# Patient Record
Sex: Female | Born: 1960 | Race: White | Hispanic: No | Smoking: Former smoker
Health system: Southern US, Community
[De-identification: ages and names within clinical notes are randomized; demographics above are authoritative.]

## PROBLEM LIST (undated history)

## (undated) DIAGNOSIS — T7840XA Allergy, unspecified, initial encounter: Secondary | ICD-10-CM

## (undated) DIAGNOSIS — E785 Hyperlipidemia, unspecified: Secondary | ICD-10-CM

## (undated) HISTORY — DX: Allergy, unspecified, initial encounter: T78.40XA

## (undated) HISTORY — DX: Hyperlipidemia, unspecified: E78.5

---

## 2011-12-20 HISTORY — PX: BLADDER NECK RECONSTRUCTION: SHX1239

## 2016-04-25 ENCOUNTER — Other Ambulatory Visit (HOSPITAL_COMMUNITY): Payer: Self-pay | Admitting: Family Medicine

## 2016-04-25 DIAGNOSIS — Z8249 Family history of ischemic heart disease and other diseases of the circulatory system: Secondary | ICD-10-CM | POA: Diagnosis not present

## 2016-04-25 DIAGNOSIS — R1900 Intra-abdominal and pelvic swelling, mass and lump, unspecified site: Secondary | ICD-10-CM

## 2016-04-25 DIAGNOSIS — E785 Hyperlipidemia, unspecified: Secondary | ICD-10-CM | POA: Diagnosis not present

## 2016-04-28 ENCOUNTER — Ambulatory Visit (HOSPITAL_COMMUNITY)
Admission: RE | Admit: 2016-04-28 | Discharge: 2016-04-28 | Disposition: A | Discharge status: Home / Self Care | Payer: BLUE CROSS/BLUE SHIELD | Source: Ambulatory Visit | Attending: Family Medicine | Admitting: Family Medicine

## 2016-04-28 ENCOUNTER — Other Ambulatory Visit (HOSPITAL_COMMUNITY): Payer: Self-pay | Admitting: Family Medicine

## 2016-04-28 DIAGNOSIS — R1900 Intra-abdominal and pelvic swelling, mass and lump, unspecified site: Secondary | ICD-10-CM | POA: Diagnosis not present

## 2016-04-28 MED ORDER — IOPAMIDOL (ISOVUE-300) INJECTION 61%
INTRAVENOUS | Status: AC
  Administered 2016-04-28: 100 mL
  Filled 2016-04-28: qty 100

## 2017-01-25 DIAGNOSIS — J209 Acute bronchitis, unspecified: Secondary | ICD-10-CM | POA: Diagnosis not present

## 2017-07-04 DIAGNOSIS — H31092 Other chorioretinal scars, left eye: Secondary | ICD-10-CM | POA: Diagnosis not present

## 2017-10-18 ENCOUNTER — Telehealth: Payer: Self-pay

## 2017-10-18 NOTE — Telephone Encounter (Signed)
Pre visit call completed 

## 2017-10-19 ENCOUNTER — Encounter: Payer: Self-pay | Admitting: Medical

## 2017-10-19 ENCOUNTER — Ambulatory Visit (INDEPENDENT_AMBULATORY_CARE_PROVIDER_SITE_OTHER): Payer: BLUE CROSS/BLUE SHIELD | Admitting: Medical

## 2017-10-19 VITALS — BP 116/74 | HR 72 | Temp 98.4°F | Resp 16 | Ht 68.0 in | Wt 186.4 lb

## 2017-10-19 DIAGNOSIS — D179 Benign lipomatous neoplasm, unspecified: Secondary | ICD-10-CM

## 2017-10-19 DIAGNOSIS — J3089 Other allergic rhinitis: Secondary | ICD-10-CM

## 2017-10-19 MED ORDER — LEVOCETIRIZINE DIHYDROCHLORIDE 5 MG PO TABS: 5.0000 mg | ORAL_TABLET | Freq: Every evening | ORAL | 0 refills | Status: DC

## 2017-10-19 MED ORDER — MONTELUKAST SODIUM 10 MG PO TABS: 10.0000 mg | ORAL_TABLET | Freq: Every day | ORAL | 0 refills | Status: DC

## 2017-10-19 MED ORDER — AZITHROMYCIN 250 MG PO TABS: ORAL_TABLET | ORAL | 0 refills | Status: DC

## 2017-10-19 MED ORDER — FLUTICASONE PROPIONATE 50 MCG/ACT NA SUSP: 2.0000 | Freq: Every day | NASAL | 0 refills | Status: DC

## 2017-10-19 MED FILL — AZITHROMYCIN 250 MG TABLET: 250 | 5 days supply | Qty: 6 | Fill #0

## 2017-10-19 NOTE — Patient Instructions (Addendum)
For allergic rhinitis fall and spring recommend xyzal and flonase early on in seasons. And could add montelukast.(dc current allergy med regimen)  For history of lipomas ask you show Dr. Lorelei Pont Ct and palpate your lipomas.May refer to general surgeon.  Low grade fever last night but not now. In light of recent bronchitis if fever returns recommend start azithromycin antibiotic.  Follow up with pcp on Monday or as needed

## 2017-10-19 NOTE — Progress Notes (Signed)
Subjective:    Patient ID: Ashley Wood, female    DOB: 16-Aug-1961, 56 y.o.   MRN: [id number]  HPI  Pt in for first time.   Pt from [place], She does not exercise regularly. Pt eats healthy, low sugar diet, 2 cups a day. 2 children.   Pt states she had some recent bronchitis for 6 weeks. Some nasal congestion and sneezing on and off over past 6 weeks. She states 2-3 weeks or so into allergy flare had bronchitis. Pt states she never took antibiotic. She overall feels better. In fall she states usually does get allergies. She only takes Human resources officer daily. If her allergies worsen add benadryl. Recently added flonase. She explains her regimen may not be working.  In past allergies late summer and early fall.   Pt has high cholesterol history. Pt is on lipitor.   Pt states menopause since 2010.     Review of Systems  Constitutional: Positive for chills. Negative for fatigue and fever.       But low grade fever last night. Now no chills or fever.  HENT: Negative for congestion, ear discharge, nosebleeds, postnasal drip, rhinorrhea, sinus pain and sinus pressure.        Many of allergy symptoms now better.  Respiratory: Negative for cough, chest tightness, shortness of breath and wheezing.        Patient state for 3 weeks had cough that sounded like a seal at times.  Cardiovascular: Negative for chest pain and palpitations.  Gastrointestinal: Negative for abdominal pain, nausea and rectal pain.  Musculoskeletal: Negative for back pain.  Skin: Negative for rash.  Neurological: Negative for dizziness, syncope, weakness and headaches.  Hematological: Negative for adenopathy. Does not bruise/bleed easily.  Psychiatric/Behavioral: Negative for behavioral problems, confusion, decreased concentration and hallucinations. The patient is not nervous/anxious and is not hyperactive.    Past Medical History:  Diagnosis Date  . Hyperlipidemia      Social History   Social History  . Marital  status: Divorced    Spouse name: N/A  . Number of children: N/A  . Years of education: N/A   Occupational History  . Not on file.   Social History Main Topics  . Smoking status: Never Smoker  . Smokeless tobacco: Never Used  . Alcohol use Yes  . Drug use: Unknown  . Sexual activity: Not on file   Other Topics Concern  . Not on file   Social History Narrative  . No narrative on file    Past Surgical History:  Procedure Laterality Date  . BLADDER NECK RECONSTRUCTION  2013    Family History  Problem Relation Age of Onset  . Hyperlipidemia Mother   . Breast cancer Mother   . Hypertension Father   . Hyperlipidemia Father   . Breast cancer Maternal Grandmother     No Known Allergies  No current outpatient prescriptions on file prior to visit.   No current facility-administered medications on file prior to visit.     BP 116/74   Pulse 72   Temp 98.4 F (36.9 C) (Oral)   Resp 16   Ht 5\' 8"  (1.727 m)   Wt 186 lb 6.4 oz (84.6 kg)   SpO2 100%   BMI 28.34 kg/m       Objective:   Physical Exam   General  Mental Status - Alert. General Appearance - Well groomed. Not in acute distress.  Skin Rashes- No Rashes.  HEENT Head- Normal. Ear Auditory Canal - Left-  Normal. Right - Normal.Tympanic Membrane- Left- Normal. Right- Normal. Eye Sclera/Conjunctiva- Left- Normal. Right- Normal. Nose & Sinuses Nasal Mucosa- Left-  Boggy and Congested. Right-  Boggy and  Congested.Bilateral maxillary and frontal sinus pressure. Mouth & Throat Lips: Upper Lip- Normal: no dryness, cracking, pallor, cyanosis, or vesicular eruption. Lower Lip-Normal: no dryness, cracking, pallor, cyanosis or vesicular eruption. Buccal Mucosa- Bilateral- No Aphthous ulcers. Oropharynx- No Discharge or Erythema. Tonsils: Characteristics- Bilateral- No Erythema or Congestion. Size/Enlargement- Bilateral- No enlargement. Discharge- bilateral-None.  Neck Neck- Supple. No Masses.   Chest and  Lung Exam Auscultation: Breath Sounds:-Clear even and unlabored.  Cardiovascular Auscultation:Rythm- Regular, rate and rhythm. Murmurs & Other Heart Sounds:Ausculatation of the heart reveal- No Murmurs.  Lymphatic Head & Neck General Head & Neck Lymphatics: Bilateral: Description- No Localized lymphadenopathy.  Skin- left elbow feels like moderate sized lipoma. Left upper cva area and anterior thorax rt lower rib area.      Assessment & Plan:  For allergic rhinitis fall and spring recommend xyzal and flonase early on in seasons. And could add montelukast.(dc current allergy med regimen)  For history of lipomas ask you show Dr. Lorelei Pont Ct and palpate your lipomas.May refer to general surgeon.  Low grade fever last night but not now. In light of recent bronchitis if fever returns recommend start azithromycin antibiotic.  Follow up with pcp on Monday or as needed  Johncarlos Holtsclaw, [place], [place]

## 2017-10-21 NOTE — Progress Notes (Addendum)
[facility] [address], [place], [place] [address] [phone]  Date:  10/23/2017   Name:  Ashley Wood   DOB:  05/09/1961   MRN:  [id number]  PCP:  Ashley Mclean, MD    Chief Complaint: Annual Exam   History of Present Illness:  Ashley Wood is a 56 y.o. very pleasant female patient who presents with the following:  New patient to my clinic, here today because she needs a CPE/ PPW for her job History of allergies and hyperlipidemia  She needs biometrics done for her job- we will order these labs for her today Her prior PCP left his practice so she needs to establish with a new person, and she moved from [place] to this area for her job  She does have hyperlipidemia but this has been well controlled with medication.  She ended up coming off her lipitor- she ran out and stopped taking it as she was not sure if she needed to continue to take it.   She is a former smoker She has gained about 10 lbs over the last couple of years, and admits she has not been taking the same care of herself  She was seen here recently with allergies that "turned into bronchitis".   She took abx in March and again last week.    She is an Primary school teacher. She has 2 grown kids and 4 grands- they live near [place] She moved here due to her job  In her free time she likes to spent time with her family near [place] She and her friends enjoy looking at [employer] was in 2014- she needs to catch up on this, she does have a family history of breast cancer Flu shot done for the year   She is fasting today for labs and would like to have BS Patient Active Problem List   Diagnosis Date Noted  . Hyperlipidemia 10/23/2017    Past Medical History:  Diagnosis Date  . Allergy   . Hyperlipidemia     Past Surgical History:  Procedure Laterality Date  . BLADDER NECK RECONSTRUCTION  2013    Social History    Tobacco Use  . Smoking status: Former Smoker    Packs/day: 1.00    Years: 10.00    Pack years: 10.00    Types: Cigarettes    Last attempt to quit: 10/19/1985    Years since quitting: 32.0  . Smokeless tobacco: Never Used  Substance Use Topics  . Alcohol use: Yes    Comment: 1 glass wine  every other night.  . Drug use: No    Family History  Problem Relation Age of Onset  . Hyperlipidemia Mother   . Breast cancer Mother   . Hypertension Father   . Hyperlipidemia Father   . Breast cancer Maternal Grandmother     No Known Allergies  Medication list has been reviewed and updated.  Current Outpatient Medications on File Prior to Visit  Medication Sig Dispense Refill  . aspirin 81 MG tablet Take 81 mg by mouth daily.    Marland Kitchen atorvastatin (LIPITOR) 10 MG tablet Take 10 mg by mouth daily.    Marland Kitchen azithromycin (ZITHROMAX) 250 MG tablet Take 2 tablets by mouth on day 1, followed by 1 tablet by mouth daily for 4 days. 6 tablet 0  . fluticasone (FLONASE) 50 MCG/ACT nasal spray Place 2 sprays into both nostrils daily. Anna  g 0  . levocetirizine (XYZAL) 5 MG tablet Take 1 tablet (5 mg total) by mouth every evening. 90 tablet 0  . montelukast (SINGULAIR) 10 MG tablet Take 1 tablet (10 mg total) by mouth at bedtime. 90 tablet 0   No current facility-administered medications on file prior to visit.     Review of Systems:  As per HPI- otherwise negative.   Physical Examination: Vitals:   10/23/17 1506  BP: 132/80  Pulse: 60  Resp: 16  Temp: 97.7 F (36.5 C)  SpO2: 100%   Vitals:   10/23/17 1506  Weight: 185 lb 3.2 oz (84 kg)   Body mass index is 28.16 kg/m. Ideal Body Weight:    GEN: WDWN, NAD, Non-toxic, A & O x 3, mild overweight, looks well HEENT: Atraumatic, Normocephalic. Neck supple. No masses, No LAD.    Bilateral TM wnl, oropharynx normal.  PEERL,EOMI.   Ears and Nose: No external deformity. CV: RRR, No M/G/R. No JVD. No thrill. No extra heart sounds. PULM: CTA B,  no wheezes, crackles, rhonchi. No retractions. No resp. distress. No accessory muscle use. ABD: S, NT, ND, +BS. No rebound. No HSM. EXTR: No c/c/e NEURO Normal gait.  PSYCH: Normally interactive. Conversant. Not depressed or anxious appearing.  Calm demeanor.  She has a bump overlying her RUQ- feels like a superficial lipoma.  approx 3 x 3 cm in size   Assessment and Plan: Hyperlipidemia, unspecified hyperlipidemia type - Plan: Lipid panel  Screening for deficiency anemia - Plan: CBC  Screening for diabetes mellitus - Plan: Comprehensive metabolic panel, Hemoglobin A1c  Encounter for hepatitis C screening test for low risk patient - Plan: Hepatitis C antibody  Lipoma of abdominal wall - Plan: US Abdomen Limited RUQ  Rosacea - Plan: Azelaic Acid 15 % cream  Here today to establish care with me and discuss a couple of things.  Labs pending as above Will complete her biometric paperwork for her when results are in Encouraged mammogram asap  Her colonoscopy is UTD   Signed Lamar Blinks, MD  Received her labs- called pt and LMOM.  I am going to put her back on her lipitor.  Will rx for her   Letter to pt as well  Results for orders placed or performed in visit on 10/23/17  CBC  Result Value Ref Range   WBC 5.8 4.0 - 10.5 K/uL   RBC 4.95 3.87 - 5.11 Mil/uL   Platelets 201.0 150.0 - 400.0 K/uL   Hemoglobin 14.4 12.0 - 15.0 g/dL   HCT 43.2 36.0 - 46.0 %   MCV 87.4 78.0 - 100.0 fl   MCHC 33.3 30.0 - 36.0 g/dL   RDW 14.2 11.5 - 15.5 %  Comprehensive metabolic panel  Result Value Ref Range   Sodium 138 135 - 145 mEq/L   Potassium 4.3 3.5 - 5.1 mEq/L   Chloride 103 96 - 112 mEq/L   CO2 25 19 - 32 mEq/L   Glucose, Bld 84 70 - 99 mg/dL   BUN 15 6 - 23 mg/dL   Creatinine, Ser 0.86 0.40 - 1.20 mg/dL   Total Bilirubin 0.6 0.2 - 1.2 mg/dL   Alkaline Phosphatase 73 39 - 117 U/L   AST 18 0 - 37 U/L   ALT 23 0 - 35 U/L   Total Protein 7.9 6.0 - 8.3 g/dL   Albumin 4.9 3.5 -  5.2 g/dL   Calcium 10.2 8.4 - 10.5 mg/dL   GFR 71.90 >60.00 mL/min  Hemoglobin A1c  Result Value Ref Range   Hgb A1c MFr Bld 5.4 4.6 - 6.5 %  Lipid panel  Result Value Ref Range   Cholesterol 204 (H) 0 - 200 mg/dL   Triglycerides 223.0 (H) 0.0 - 149.0 mg/dL   HDL 35.80 (L) >39.00 mg/dL   VLDL 44.6 (H) 0.0 - 40.0 mg/dL   Total CHOL/HDL Ratio 6    NonHDL 167.98   Hepatitis C antibody  Result Value Ref Range   Hepatitis C Ab NON-REACTIVE NON-REACTI   SIGNAL TO CUT-OFF 0.01 <1.00  LDL cholesterol, direct  Result Value Ref Range   Direct LDL 146.0 mg/dL

## 2017-10-23 ENCOUNTER — Other Ambulatory Visit: Payer: Self-pay | Admitting: Family Medicine

## 2017-10-23 ENCOUNTER — Encounter: Payer: Self-pay | Admitting: Family Medicine

## 2017-10-23 ENCOUNTER — Ambulatory Visit (INDEPENDENT_AMBULATORY_CARE_PROVIDER_SITE_OTHER): Payer: BLUE CROSS/BLUE SHIELD | Admitting: Family Medicine

## 2017-10-23 VITALS — BP 132/80 | HR 60 | Temp 97.7°F | Resp 16 | Wt 185.2 lb

## 2017-10-23 DIAGNOSIS — Z1159 Encounter for screening for other viral diseases: Secondary | ICD-10-CM | POA: Diagnosis not present

## 2017-10-23 DIAGNOSIS — Z131 Encounter for screening for diabetes mellitus: Secondary | ICD-10-CM | POA: Diagnosis not present

## 2017-10-23 DIAGNOSIS — Z1231 Encounter for screening mammogram for malignant neoplasm of breast: Secondary | ICD-10-CM

## 2017-10-23 DIAGNOSIS — D171 Benign lipomatous neoplasm of skin and subcutaneous tissue of trunk: Secondary | ICD-10-CM

## 2017-10-23 DIAGNOSIS — E785 Hyperlipidemia, unspecified: Secondary | ICD-10-CM

## 2017-10-23 DIAGNOSIS — L719 Rosacea, unspecified: Secondary | ICD-10-CM | POA: Diagnosis not present

## 2017-10-23 DIAGNOSIS — Z130 Encounter for screening for diseases of the blood and blood-forming organs and certain disorders involving the immune mechanism: Secondary | ICD-10-CM

## 2017-10-23 MED ORDER — AZELAIC ACID 15 % EX GEL: CUTANEOUS | 6 refills | Status: DC

## 2017-10-23 NOTE — Patient Instructions (Signed)
Please do schedule your mammo asap!  This can be done here in this building  I will be in touch with your labs

## 2017-10-24 LAB — LIPID PANEL
CHOL/HDL RATIO: 6
CHOLESTEROL: 204 mg/dL — AB (ref 0–200)
HDL: 35.8 mg/dL — ABNORMAL LOW (ref >39.00)
NONHDL: 167.98
TRIGLYCERIDES: 223 mg/dL — AB (ref 0.0–149.0)
VLDL: 44.6 mg/dL — ABNORMAL HIGH (ref 0.0–40.0)

## 2017-10-24 LAB — COMPREHENSIVE METABOLIC PANEL
ALBUMIN: 4.9 g/dL (ref 3.5–5.2)
ALT: 23 U/L (ref 0–35)
AST: 18 U/L (ref 0–37)
Alkaline Phosphatase: 73 U/L (ref 39–117)
BILIRUBIN TOTAL: 0.6 mg/dL (ref 0.2–1.2)
BUN: 15 mg/dL (ref 6–23)
CALCIUM: 10.2 mg/dL (ref 8.4–10.5)
CHLORIDE: 103 meq/L (ref 96–112)
CO2: 25 mEq/L (ref 19–32)
CREATININE: 0.86 mg/dL (ref 0.40–1.20)
GFR: 71.9 mL/min (ref >60.00)
Glucose, Bld: 84 mg/dL (ref 70–99)
Potassium: 4.3 mEq/L (ref 3.5–5.1)
Sodium: 138 mEq/L (ref 135–145)
Total Protein: 7.9 g/dL (ref 6.0–8.3)

## 2017-10-24 LAB — CBC
HCT: 43.2 % (ref 36.0–46.0)
HEMOGLOBIN: 14.4 g/dL (ref 12.0–15.0)
MCHC: 33.3 g/dL (ref 30.0–36.0)
MCV: 87.4 fl (ref 78.0–100.0)
PLATELETS: 201 10*3/uL (ref 150.0–400.0)
RBC: 4.95 Mil/uL (ref 3.87–5.11)
RDW: 14.2 % (ref 11.5–15.5)
WBC: 5.8 10*3/uL (ref 4.0–10.5)

## 2017-10-24 LAB — HEPATITIS C ANTIBODY
HEP C AB: NONREACTIVE
SIGNAL TO CUT-OFF: 0.01 (ref <1.00)

## 2017-10-24 LAB — HEMOGLOBIN A1C: HEMOGLOBIN A1C: 5.4 % (ref 4.6–6.5)

## 2017-10-24 LAB — LDL CHOLESTEROL, DIRECT: LDL DIRECT: 146 mg/dL

## 2017-10-24 MED ORDER — ATORVASTATIN CALCIUM 10 MG PO TABS: 10.0000 mg | ORAL_TABLET | Freq: Every day | ORAL | 3 refills | Status: DC

## 2017-10-24 NOTE — Addendum Note (Signed)
Addended by: Lamar Blinks C on: 10/24/2017 03:59 PM   Modules accepted: Orders

## 2017-10-30 ENCOUNTER — Ambulatory Visit (HOSPITAL_BASED_OUTPATIENT_CLINIC_OR_DEPARTMENT_OTHER)
Admission: RE | Admit: 2017-10-30 | Discharge: 2017-10-30 | Disposition: A | Discharge status: Home / Self Care | Payer: BLUE CROSS/BLUE SHIELD | Source: Ambulatory Visit | Attending: Family Medicine | Admitting: Family Medicine

## 2017-10-30 ENCOUNTER — Encounter (HOSPITAL_BASED_OUTPATIENT_CLINIC_OR_DEPARTMENT_OTHER): Payer: Self-pay | Admitting: Radiology

## 2017-10-30 DIAGNOSIS — Z1231 Encounter for screening mammogram for malignant neoplasm of breast: Secondary | ICD-10-CM | POA: Insufficient documentation

## 2017-10-30 DIAGNOSIS — D171 Benign lipomatous neoplasm of skin and subcutaneous tissue of trunk: Secondary | ICD-10-CM | POA: Diagnosis not present

## 2017-10-30 DIAGNOSIS — K829 Disease of gallbladder, unspecified: Secondary | ICD-10-CM | POA: Diagnosis not present

## 2017-10-30 DIAGNOSIS — K760 Fatty (change of) liver, not elsewhere classified: Secondary | ICD-10-CM | POA: Diagnosis not present

## 2017-10-31 ENCOUNTER — Encounter: Payer: Self-pay | Admitting: Family Medicine

## 2017-10-31 ENCOUNTER — Telehealth: Payer: Self-pay | Admitting: Family Medicine

## 2017-10-31 NOTE — Telephone Encounter (Signed)
Patient is follow up on paper work. Patient states that she received a voicemail from [facility] stating that paper work was completed. Please advise patient when paper work is ready for pick up.

## 2017-11-01 ENCOUNTER — Encounter: Payer: Self-pay | Admitting: Family Medicine

## 2017-11-29 ENCOUNTER — Other Ambulatory Visit: Payer: Self-pay | Admitting: Medical

## 2017-12-27 ENCOUNTER — Other Ambulatory Visit: Payer: Self-pay | Admitting: Emergency Medicine

## 2017-12-27 MED ORDER — FLUTICASONE PROPIONATE 50 MCG/ACT NA SUSP: NASAL | 1 refills | Status: DC

## 2018-01-12 ENCOUNTER — Other Ambulatory Visit: Payer: Self-pay | Admitting: Emergency Medicine

## 2018-01-12 MED ORDER — LEVOCETIRIZINE DIHYDROCHLORIDE 5 MG PO TABS: 5.0000 mg | ORAL_TABLET | Freq: Every evening | ORAL | 0 refills | Status: DC

## 2018-01-12 MED ORDER — MONTELUKAST SODIUM 10 MG PO TABS: 10.0000 mg | ORAL_TABLET | Freq: Every day | ORAL | 1 refills | Status: DC

## 2018-01-18 ENCOUNTER — Other Ambulatory Visit: Payer: Self-pay | Admitting: Emergency Medicine

## 2018-04-11 ENCOUNTER — Other Ambulatory Visit: Payer: Self-pay | Admitting: Family Medicine

## 2018-07-01 ENCOUNTER — Other Ambulatory Visit: Payer: Self-pay | Admitting: Family Medicine

## 2018-07-10 ENCOUNTER — Ambulatory Visit (INDEPENDENT_AMBULATORY_CARE_PROVIDER_SITE_OTHER): Payer: BLUE CROSS/BLUE SHIELD | Admitting: Medical

## 2018-07-10 ENCOUNTER — Encounter: Payer: Self-pay | Admitting: Medical

## 2018-07-10 VITALS — BP 138/87 | HR 80 | Temp 98.8°F | Ht 68.5 in | Wt 199.6 lb

## 2018-07-10 DIAGNOSIS — J40 Bronchitis, not specified as acute or chronic: Secondary | ICD-10-CM | POA: Diagnosis not present

## 2018-07-10 DIAGNOSIS — R05 Cough: Secondary | ICD-10-CM | POA: Diagnosis not present

## 2018-07-10 DIAGNOSIS — R059 Cough, unspecified: Secondary | ICD-10-CM

## 2018-07-10 DIAGNOSIS — H6690 Otitis media, unspecified, unspecified ear: Secondary | ICD-10-CM

## 2018-07-10 DIAGNOSIS — L989 Disorder of the skin and subcutaneous tissue, unspecified: Secondary | ICD-10-CM

## 2018-07-10 DIAGNOSIS — Z1283 Encounter for screening for malignant neoplasm of skin: Secondary | ICD-10-CM

## 2018-07-10 DIAGNOSIS — R0981 Nasal congestion: Secondary | ICD-10-CM | POA: Diagnosis not present

## 2018-07-10 DIAGNOSIS — J309 Allergic rhinitis, unspecified: Secondary | ICD-10-CM | POA: Diagnosis not present

## 2018-07-10 MED ORDER — METHYLPREDNISOLONE ACETATE 40 MG/ML IJ SUSP
40.0000 mg | Freq: Once | INTRAMUSCULAR | Status: AC
  Administered 2018-07-10: 40 mg via INTRAMUSCULAR

## 2018-07-10 MED ORDER — AZITHROMYCIN 250 MG PO TABS: ORAL_TABLET | ORAL | 0 refills | Status: DC

## 2018-07-10 MED ORDER — BENZONATATE 100 MG PO CAPS: 100.0000 mg | ORAL_CAPSULE | Freq: Three times a day (TID) | ORAL | 0 refills | Status: DC | PRN

## 2018-07-10 NOTE — Patient Instructions (Signed)
You do have some recent allergic rhinitis signs and symptoms over the past month.  On exam today he has some findings concerning for right ear infection.  Also concern for bronchitis.  For allergy flare, recommend that you continue your 3 medication regimen and we gave you Depo-Medrol 40 mg injection today.  For probable right ear infection and bronchitis, I am prescribing a azithromycin antibiotic.  Also prescribing benzonatate for cough.  For your recent skin concerns and nonhealing right upper lip small skin lesion, I put in referral to dermatologist.  Follow-up in 7 to 10 days or as needed.

## 2018-07-10 NOTE — Progress Notes (Signed)
Subjective:    Patient ID: Ashley Wood, female    DOB: 1961-01-11, 57 y.o.   MRN: [id number]  HPI  Pt in states she has some recent nasal congestion, sinus pressure, st and some chest congestion. She states has symptoms for 4 weeks. Most prominent mild chest congestion. Sinus symptoms were not that prominent.   Pt states although sinus are not real congested she can feel significant pnd in late afternoon. Throat hurts more in late afternoon.   Early on 4 weeks felt feverish but none since.  Pt is bringing up mucus when she coughs. She states light green in color.  Pt is currently on allegra, singulair and flonase. Pt states xyzal which she tried made her too sleepy. Pt took herself off allegra for 2 days and her allergy symptoms flared when she was sick. She ran out of montelukast for 5 days before her symptoms started 4 weeks.   Review of Systems  Constitutional: Negative for chills, fatigue and fever.       Early on fever 4 weeks ago but none since.  HENT: Positive for congestion and sore throat. Negative for sinus pressure and sinus pain.   Respiratory: Positive for cough. Negative for chest tightness, shortness of breath and wheezing.   Cardiovascular: Negative for chest pain and palpitations.  Gastrointestinal: Negative for abdominal pain, nausea and vomiting.  Musculoskeletal: Negative for back pain and myalgias.  Skin:       New skin lesion rt upper back.   Neurological: Negative for dizziness, tremors, seizures, weakness and headaches.  Hematological: Negative for adenopathy. Does not bruise/bleed easily.  Psychiatric/Behavioral: Negative for behavioral problems.    Past Medical History:  Diagnosis Date  . Allergy   . Hyperlipidemia      Social History   Socioeconomic History  . Marital status: Divorced    Spouse name: Not on file  . Number of children: Not on file  . Years of education: Not on file  . Highest education level: Not on file  Occupational  History  . Not on file  Social Needs  . Financial resource strain: Not on file  . Food insecurity:    Worry: Not on file    Inability: Not on file  . Transportation needs:    Medical: Not on file    Non-medical: Not on file  Tobacco Use  . Smoking status: Former Smoker    Packs/day: 1.00    Years: 10.00    Pack years: 10.00    Types: Cigarettes    Last attempt to quit: 10/19/1985    Years since quitting: 32.7  . Smokeless tobacco: Never Used  Substance and Sexual Activity  . Alcohol use: Yes    Comment: 1 glass wine  every other night.  . Drug use: No  . Sexual activity: Not on file  Lifestyle  . Physical activity:    Days per week: Not on file    Minutes per session: Not on file  . Stress: Not on file  Relationships  . Social connections:    Talks on phone: Not on file    Gets together: Not on file    Attends religious service: Not on file    Active member of club or organization: Not on file    Attends meetings of clubs or organizations: Not on file    Relationship status: Not on file  . Intimate partner violence:    Fear of current or ex partner: Not on file  Emotionally abused: Not on file    Physically abused: Not on file    Forced sexual activity: Not on file  Other Topics Concern  . Not on file  Social History Narrative  . Not on file    Past Surgical History:  Procedure Laterality Date  . BLADDER NECK RECONSTRUCTION  2013    Family History  Problem Relation Age of Onset  . Hyperlipidemia Mother   . Breast cancer Mother   . Hypertension Father   . Hyperlipidemia Father   . Breast cancer Maternal Grandmother     No Known Allergies  Current Outpatient Medications on File Prior to Visit  Medication Sig Dispense Refill  . aspirin 81 MG tablet Take 81 mg by mouth daily.    Marland Kitchen atorvastatin (LIPITOR) 10 MG tablet Take 1 tablet (10 mg total) daily by mouth. 90 tablet 3  . Azelaic Acid 15 % cream Apply up to twice a day as needed . 50 g 6  .  fexofenadine (ALLEGRA ALLERGY) 180 MG tablet Take 1 tablet by mouth daily.    . fluticasone (FLONASE) 50 MCG/ACT nasal spray SPRAY 2 SPRAYS INTO EACH NOSTRIL EVERY DAY 48 g 1  . montelukast (SINGULAIR) 10 MG tablet Take 1 tablet (10 mg total) by mouth at bedtime. 90 tablet 1   No current facility-administered medications on file prior to visit.     BP 138/87 (BP Location: Left Arm, Patient Position: Sitting, Cuff Size: Large)   Pulse 80   Temp 98.8 F (37.1 C) (Oral)   Ht 5' 8.5" (1.74 m)   Wt 199 lb 9.6 oz (90.5 kg)   SpO2 100%   BMI 29.91 kg/m       Objective:   Physical Exam  General  Mental Status - Alert. General Appearance - Well groomed. Not in acute distress.  Skin Rashes- No Rashes.  HEENT Head- Normal. Ear Auditory Canal - Left- Normal. Right - Normal.Tympanic Membrane- Left- Normal. Right- mild red Eye Sclera/Conjunctiva- Left- Normal. Right- Normal. Nose & Sinuses Nasal Mucosa- Left-  Boggy and Congested. Right-  Boggy and  Congested.Bilateral  Faint maxillary and frontal sinus pressure. Mouth & Throat Lips: Upper Lip- Normal: no dryness, cracking, pallor, cyanosis, or vesicular eruption. Lower Lip-Normal: no dryness, cracking, pallor, cyanosis or vesicular eruption. Buccal Mucosa- Bilateral- No Aphthous ulcers. Oropharynx- No Discharge or Erythema. +pnd Tonsils: Characteristics- Bilateral-mild  Erythema or Congestion. Size/Enlargement- Bilateral- No enlargement. Discharge- bilateral-None.  Neck Neck- Supple. No Masses.   Chest and Lung Exam Auscultation: Breath Sounds:-Clear even and unlabored.  Cardiovascular Auscultation:Rythm- Regular, rate and rhythm. Murmurs & Other Heart Sounds:Ausculatation of the heart reveal- No Murmurs.  Lymphatic Head & Neck General Head & Neck Lymphatics: Bilateral: Description- No Localized lymphadenopathy.  Skin- scattered moles on back. Probable seborrheic keratosis. Rt upper back. hypertimented area(this looks  like birth mark)  Rt upper lip- small scab edge of lip line.        Assessment & Plan:  You do have some recent allergic rhinitis signs and symptoms over the past month.  On exam today he has some findings concerning for right ear infection.  Also concern for bronchitis.  For allergy flare, recommend that you continue your 3 medication regimen and we gave you Depo-Medrol 40 mg injection today.  For probable right ear infection and bronchitis, I am prescribing a azithromycin antibiotic.  Also prescribing benzonatate for cough.  For your recent skin concerns and nonhealing right upper lip small skin lesion, I put in referral  to dermatologist.  Follow-up in 7 to 10 days or as needed.  Mackie Pai, PA-C

## 2018-08-12 ENCOUNTER — Other Ambulatory Visit: Payer: Self-pay | Admitting: Family Medicine

## 2018-10-23 NOTE — Progress Notes (Addendum)
[facility] [address] [place], [place] [address] [phone]  Date:  10/25/2018   Name:  Ashley Wood   DOB:  June 20, 1961   MRN:  [id number]  PCP:  Darreld Mclean, MD    Chief Complaint: Annual Exam (pap?, has had flu shot)   History of Present Illness:  Ashley Wood is a 57 y.o. very pleasant female patient who presents with the following:  Here today for a CPE History of hyperlipidemia Seen her a year ago- she was off her lipitor at that time and chl was high, so we re-started it.   She needs biometrics done for her job- we will order these labs for her today Her prior PCP left his practice so she needs to establish with a new person, and she moved from [place] to this area for her job She does have hyperlipidemia but this has been well controlled with medication.  She ended up coming off her lipitor- she ran out and stopped taking it as she was not sure if she needed to continue to take it.   She is a former smoker She has gained about 10 lbs over the last couple of years, and admits she has not been taking the same care of herself She was seen here recently with allergies that "turned into bronchitis".   She took abx in March and again last week.   She is an Primary school teacher. She has 2 grown kids and 4 grands- they live near [place] She moved here due to her job  In her free time she likes to spent time with her family near [place] She and her friends enjoy looking at [employer]: a year ago Pap: will do today, she thinks her last was about 3 years ago. She did have an ASCUS pap 10- 15 years ago, had some sort of cryotherapy and ok since She also does have a bladder lift  Colon: 2014 Immun: flu, done at work  Labs:  Due - she is fasting today Shingrix: she is interested in starting this today   She has a work form to fill out  [employer] from Last 3 Encounters:  10/25/18 190 lb  (86.2 kg)  07/10/18 199 lb 9.6 oz (90.5 kg)  10/23/17 185 lb 3.2 oz (84 kg)   She is down from her weight in June.  She is watching her diet   She also again notes a small, mobile and subcue mass in her right upper abd wall- we did an [place] for this last year as follows: IMPRESSION: 1. Subcutaneous 2.3 x 1.4 x 3.3 cm solid mass at the area of palpable concern as indicated by the patient in the ventral right abdominal wall, with sonographic features compatible with a lipoma. 2. Mild diffuse hepatic steatosis. 3. Tiny 3 mm gallbladder polyp versus sludge ball, for which no follow-up is required. No cholelithiasis. No biliary ductal dilatation.  She feels like this area is a bit more bothersome recently- offered to have her see general surgery  Patient Active Problem List   Diagnosis Date Noted  . Hyperlipidemia 10/23/2017    Past Medical History:  Diagnosis Date  . Allergy   . Hyperlipidemia     Past Surgical History:  Procedure Laterality Date  . BLADDER NECK RECONSTRUCTION  2013    Social History   Tobacco Use  . Smoking status: Former Smoker    Packs/day: 1.00  Years: 10.00    Pack years: 10.00    Types: Cigarettes    Last attempt to quit: 10/19/1985    Years since quitting: 33.0  . Smokeless tobacco: Never Used  Substance Use Topics  . Alcohol use: Yes    Comment: 1 glass wine  every other night.  . Drug use: No    Family History  Problem Relation Age of Onset  . Hyperlipidemia Mother   . Breast cancer Mother   . Hypertension Father   . Hyperlipidemia Father   . Breast cancer Maternal Grandmother     No Known Allergies  Medication list has been reviewed and updated.  Current Outpatient Medications on File Prior to Visit  Medication Sig Dispense Refill  . aspirin 81 MG tablet Take 81 mg by mouth daily.    Marland Kitchen atorvastatin (LIPITOR) 10 MG tablet Take 1 tablet (10 mg total) daily by mouth. 90 tablet 3  . fexofenadine (ALLEGRA ALLERGY) 180 MG tablet Take 1  tablet by mouth daily.    . fluticasone (FLONASE) 50 MCG/ACT nasal spray SPRAY 2 SPRAYS INTO EACH NOSTRIL EVERY DAY 48 g 2  . montelukast (SINGULAIR) 10 MG tablet Take 1 tablet (10 mg total) by mouth at bedtime. 90 tablet 1   No current facility-administered medications on file prior to visit.     Review of Systems:  As per HPI- otherwise negative.   Physical Examination: Vitals:   10/25/18 0833  BP: 112/88  Pulse: 74  Resp: 16  Temp: 98.3 F (36.8 C)  SpO2: 98%   Vitals:   10/25/18 0833  Weight: 190 lb (86.2 kg)  Height: 5' 8.5" (1.74 m)   Body mass index is 28.47 kg/m. Ideal Body Weight: Weight in (lb) to have BMI = 25: 166.5  GEN: WDWN, NAD, Non-toxic, A & O x 3, overweight, looks well  HEENT: Atraumatic, Normocephalic. Neck supple. No masses, No LAD.  Bilateral TM wnl, oropharynx normal.  PEERL,EOMI.   Ears and Nose: No external deformity. CV: RRR, No M/G/R. No JVD. No thrill. No extra heart sounds. PULM: CTA B, no wheezes, crackles, rhonchi. No retractions. No resp. distress. No accessory muscle use. ABD: S, NT, ND. No rebound. No HSM. EXTR: No c/c/e NEURO Normal gait.  PSYCH: Normally interactive. Conversant. Not depressed or anxious appearing.  Calm demeanor.  Breast: normal exam, no masses/ dimpling/ discharge Pelvic: normal, no vaginal lesions or discharge. Uterus normal, no CMT, no adnexal tendereness or masses She does again have the small, mobile mass subcue in her right upper quadrant  Pt feels like it is a bit more prominent than in the past.  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- line   Assessment and Plan: Physical exam  Screening for deficiency anemia - Plan: CBC  Hyperlipidemia, unspecified hyperlipidemia type - Plan: Lipid panel, atorvastatin (LIPITOR) 10 MG tablet  Screening for diabetes mellitus - Plan: Comprehensive metabolic panel, Hemoglobin A1c  Screening  for cervical cancer - Plan: Cytology - PAP  Screening for breast cancer - Plan: MM 3D SCREEN BREAST BILATERAL  CPE today Ordered mammo, pap and labs done today Cannot give shingrix today due to vaccine supply issue Offered to have her see general surgery about the lipoma on her abdomen but she declines for the time being  She has a work form that will need to be completed once her labs come in- will  fax for her   Signed Lamar Blinks, MD  Received her labs 11/7- filled out work form to fax in for her  Results for orders placed or performed in visit on 10/25/18  CBC  Result Value Ref Range   WBC 5.8 4.0 - 10.5 K/uL   RBC 4.81 3.87 - 5.11 Mil/uL   Platelets 209.0 150.0 - 400.0 K/uL   Hemoglobin 13.9 12.0 - 15.0 g/dL   HCT 41.1 36.0 - 46.0 %   MCV 85.4 78.0 - 100.0 fl   MCHC 33.9 30.0 - 36.0 g/dL   RDW 13.8 11.5 - 15.5 %  Comprehensive metabolic panel  Result Value Ref Range   Sodium 138 135 - 145 mEq/L   Potassium 4.1 3.5 - 5.1 mEq/L   Chloride 104 96 - 112 mEq/L   CO2 25 19 - 32 mEq/L   Glucose, Bld 117 (H) 70 - 99 mg/dL   BUN 13 6 - 23 mg/dL   Creatinine, Ser 0.81 0.40 - 1.20 mg/dL   Total Bilirubin 0.5 0.2 - 1.2 mg/dL   Alkaline Phosphatase 98 39 - 117 U/L   AST 21 0 - 37 U/L   ALT 37 (H) 0 - 35 U/L   Total Protein 7.0 6.0 - 8.3 g/dL   Albumin 4.7 3.5 - 5.2 g/dL   Calcium 9.6 8.4 - 10.5 mg/dL   GFR 76.78 >60.00 mL/min  Hemoglobin A1c  Result Value Ref Range   Hgb A1c MFr Bld 5.4 4.6 - 6.5 %  Lipid panel  Result Value Ref Range   Cholesterol 172 0 - 200 mg/dL   Triglycerides 166.0 (H) 0.0 - 149.0 mg/dL   HDL 44.70 >39.00 mg/dL   VLDL 33.2 0.0 - 40.0 mg/dL   LDL Cholesterol 94 0 - 99 mg/dL   Total CHOL/HDL Ratio 4    NonHDL 126.98

## 2018-10-25 ENCOUNTER — Encounter: Payer: Self-pay | Admitting: Family Medicine

## 2018-10-25 ENCOUNTER — Ambulatory Visit (INDEPENDENT_AMBULATORY_CARE_PROVIDER_SITE_OTHER): Payer: BLUE CROSS/BLUE SHIELD | Admitting: Family Medicine

## 2018-10-25 ENCOUNTER — Other Ambulatory Visit (HOSPITAL_COMMUNITY)
Admission: RE | Admit: 2018-10-25 | Discharge: 2018-10-25 | Disposition: A | Discharge status: Home / Self Care | Payer: BLUE CROSS/BLUE SHIELD | Source: Ambulatory Visit | Attending: Family Medicine | Admitting: Family Medicine

## 2018-10-25 VITALS — BP 110/78 | HR 74 | Temp 98.3°F | Resp 16 | Ht 68.5 in | Wt 190.0 lb

## 2018-10-25 DIAGNOSIS — E785 Hyperlipidemia, unspecified: Secondary | ICD-10-CM | POA: Diagnosis not present

## 2018-10-25 DIAGNOSIS — Z131 Encounter for screening for diabetes mellitus: Secondary | ICD-10-CM | POA: Diagnosis not present

## 2018-10-25 DIAGNOSIS — Z0000 Encounter for general adult medical examination without abnormal findings: Secondary | ICD-10-CM

## 2018-10-25 DIAGNOSIS — Z124 Encounter for screening for malignant neoplasm of cervix: Secondary | ICD-10-CM

## 2018-10-25 DIAGNOSIS — Z1239 Encounter for other screening for malignant neoplasm of breast: Secondary | ICD-10-CM

## 2018-10-25 DIAGNOSIS — Z130 Encounter for screening for diseases of the blood and blood-forming organs and certain disorders involving the immune mechanism: Secondary | ICD-10-CM | POA: Diagnosis not present

## 2018-10-25 LAB — CBC
HEMATOCRIT: 41.1 % (ref 36.0–46.0)
Hemoglobin: 13.9 g/dL (ref 12.0–15.0)
MCHC: 33.9 g/dL (ref 30.0–36.0)
MCV: 85.4 fl (ref 78.0–100.0)
PLATELETS: 209 10*3/uL (ref 150.0–400.0)
RBC: 4.81 Mil/uL (ref 3.87–5.11)
RDW: 13.8 % (ref 11.5–15.5)
WBC: 5.8 10*3/uL (ref 4.0–10.5)

## 2018-10-25 LAB — COMPREHENSIVE METABOLIC PANEL
ALBUMIN: 4.7 g/dL (ref 3.5–5.2)
ALK PHOS: 98 U/L (ref 39–117)
ALT: 37 U/L — ABNORMAL HIGH (ref 0–35)
AST: 21 U/L (ref 0–37)
BUN: 13 mg/dL (ref 6–23)
CALCIUM: 9.6 mg/dL (ref 8.4–10.5)
CO2: 25 mEq/L (ref 19–32)
Chloride: 104 mEq/L (ref 96–112)
Creatinine, Ser: 0.81 mg/dL (ref 0.40–1.20)
GFR: 76.78 mL/min (ref >60.00)
Glucose, Bld: 117 mg/dL — ABNORMAL HIGH (ref 70–99)
Potassium: 4.1 mEq/L (ref 3.5–5.1)
Sodium: 138 mEq/L (ref 135–145)
TOTAL PROTEIN: 7 g/dL (ref 6.0–8.3)
Total Bilirubin: 0.5 mg/dL (ref 0.2–1.2)

## 2018-10-25 LAB — LIPID PANEL
CHOLESTEROL: 172 mg/dL (ref 0–200)
HDL: 44.7 mg/dL (ref >39.00)
LDL Cholesterol: 94 mg/dL (ref 0–99)
NONHDL: 126.98
Total CHOL/HDL Ratio: 4
Triglycerides: 166 mg/dL — ABNORMAL HIGH (ref 0.0–149.0)
VLDL: 33.2 mg/dL (ref 0.0–40.0)

## 2018-10-25 LAB — HEMOGLOBIN A1C: HEMOGLOBIN A1C: 5.4 % (ref 4.6–6.5)

## 2018-10-25 MED ORDER — ATORVASTATIN CALCIUM 10 MG PO TABS: 10.0000 mg | ORAL_TABLET | Freq: Every day | ORAL | 3 refills | Status: DC

## 2018-10-25 NOTE — Patient Instructions (Addendum)
I will be in touch with your labs asap Please stop by the ground floor and see if they can do your mammogram today I will fill out your forms that you need for work when your labs come in   Health Maintenance, Female Adopting a healthy lifestyle and getting preventive care can go a long way to promote health and wellness. Talk with your health care provider about what schedule of regular examinations is right for you. This is a good chance for you to check in with your provider about disease prevention and staying healthy. In between checkups, there are plenty of things you can do on your own. Experts have done a lot of research about which lifestyle changes and preventive measures are most likely to keep you healthy. Ask your health care provider for more information. Weight and diet Eat a healthy diet  Be sure to include plenty of vegetables, fruits, low-fat dairy products, and lean protein.  Do not eat a lot of foods high in solid fats, added sugars, or salt.  Get regular exercise. This is one of the most important things you can do for your health. ? Most adults should exercise for at least 150 minutes each week. The exercise should increase your heart rate and make you sweat (moderate-intensity exercise). ? Most adults should also do strengthening exercises at least twice a week. This is in addition to the moderate-intensity exercise.  Maintain a healthy weight  Body mass index (BMI) is a measurement that can be used to identify possible weight problems. It estimates body fat based on height and weight. Your health care provider can help determine your BMI and help you achieve or maintain a healthy weight.  For females 68 years of age and older: ? A BMI below 18.5 is considered underweight. ? A BMI of 18.5 to 24.9 is normal. ? A BMI of 25 to 29.9 is considered overweight. ? A BMI of 30 and above is considered obese.  Watch levels of cholesterol and blood lipids  You should start  having your blood tested for lipids and cholesterol at 59 years of age, then have this test every 5 years.  You may need to have your cholesterol levels checked more often if: ? Your lipid or cholesterol levels are high. ? You are older than 59 years of age. ? You are at high risk for heart disease.  Cancer screening Lung Cancer  Lung cancer screening is recommended for adults 64-67 years old who are at high risk for lung cancer because of a history of smoking.  A yearly low-dose CT scan of the lungs is recommended for people who: ? Currently smoke. ? Have quit within the past 15 years. ? Have at least a 30-pack-year history of smoking. A pack year is smoking an average of one pack of cigarettes a day for 1 year.  Yearly screening should continue until it has been 15 years since you quit.  Yearly screening should stop if you develop a health problem that would prevent you from having lung cancer treatment.  Breast Cancer  Practice breast self-awareness. This means understanding how your breasts normally appear and feel.  It also means doing regular breast self-exams. Let your health care provider know about any changes, no matter how small.  If you are in your 20s or 30s, you should have a clinical breast exam (CBE) by a health care provider every 1-3 years as part of a regular health exam.  If you are 40  or older, have a CBE every year. Also consider having a breast X-ray (mammogram) every year.  If you have a family history of breast cancer, talk to your health care provider about genetic screening.  If you are at high risk for breast cancer, talk to your health care provider about having an MRI and a mammogram every year.  Breast cancer gene (BRCA) assessment is recommended for women who have family members with BRCA-related cancers. BRCA-related cancers include: ? Breast. ? Ovarian. ? Tubal. ? Peritoneal cancers.  Results of the assessment will determine the need for  genetic counseling and BRCA1 and BRCA2 testing.  Cervical Cancer Your health care provider may recommend that you be screened regularly for cancer of the pelvic organs (ovaries, uterus, and vagina). This screening involves a pelvic examination, including checking for microscopic changes to the surface of your cervix (Pap test). You may be encouraged to have this screening done every 3 years, beginning at age 21.  For women ages 30-65, health care providers may recommend pelvic exams and Pap testing every 3 years, or they may recommend the Pap and pelvic exam, combined with testing for human papilloma virus (HPV), every 5 years. Some types of HPV increase your risk of cervical cancer. Testing for HPV may also be done on women of any age with unclear Pap test results.  Other health care providers may not recommend any screening for nonpregnant women who are considered low risk for pelvic cancer and who do not have symptoms. Ask your health care provider if a screening pelvic exam is right for you.  If you have had past treatment for cervical cancer or a condition that could lead to cancer, you need Pap tests and screening for cancer for at least 20 years after your treatment. If Pap tests have been discontinued, your risk factors (such as having a new sexual partner) need to be reassessed to determine if screening should resume. Some women have medical problems that increase the chance of getting cervical cancer. In these cases, your health care provider may recommend more frequent screening and Pap tests.  Colorectal Cancer  This type of cancer can be detected and often prevented.  Routine colorectal cancer screening usually begins at 59 years of age and continues through 59 years of age.  Your health care provider may recommend screening at an earlier age if you have risk factors for colon cancer.  Your health care provider may also recommend using home test kits to check for hidden blood in the  stool.  A small camera at the end of a tube can be used to examine your colon directly (sigmoidoscopy or colonoscopy). This is done to check for the earliest forms of colorectal cancer.  Routine screening usually begins at age 50.  Direct examination of the colon should be repeated every 5-10 years through 59 years of age. However, you may need to be screened more often if early forms of precancerous polyps or small growths are found.  Skin Cancer  Check your skin from head to toe regularly.  Tell your health care provider about any new moles or changes in moles, especially if there is a change in a mole's shape or color.  Also tell your health care provider if you have a mole that is larger than the size of a pencil eraser.  Always use sunscreen. Apply sunscreen liberally and repeatedly throughout the day.  Protect yourself by wearing long sleeves, pants, a wide-brimmed hat, and sunglasses whenever you are   outside.  Heart disease, diabetes, and high blood pressure  High blood pressure causes heart disease and increases the risk of stroke. High blood pressure is more likely to develop in: ? People who have blood pressure in the high end of the normal range (130-139/85-89 mm Hg). ? People who are overweight or obese. ? People who are African American.  If you are 18-39 years of age, have your blood pressure checked every 3-5 years. If you are 40 years of age or older, have your blood pressure checked every year. You should have your blood pressure measured twice-once when you are at a hospital or clinic, and once when you are not at a hospital or clinic. Record the average of the two measurements. To check your blood pressure when you are not at a hospital or clinic, you can use: ? An automated blood pressure machine at a pharmacy. ? A home blood pressure monitor.  If you are between 55 years and 79 years old, ask your health care provider if you should take aspirin to prevent  strokes.  Have regular diabetes screenings. This involves taking a blood sample to check your fasting blood sugar level. ? If you are at a normal weight and have a low risk for diabetes, have this test once every three years after 59 years of age. ? If you are overweight and have a high risk for diabetes, consider being tested at a younger age or more often. Preventing infection Hepatitis B  If you have a higher risk for hepatitis B, you should be screened for this virus. You are considered at high risk for hepatitis B if: ? You were born in a country where hepatitis B is common. Ask your health care provider which countries are considered high risk. ? Your parents were born in a high-risk country, and you have not been immunized against hepatitis B (hepatitis B vaccine). ? You have HIV or AIDS. ? You use needles to inject street drugs. ? You live with someone who has hepatitis B. ? You have had sex with someone who has hepatitis B. ? You get hemodialysis treatment. ? You take certain medicines for conditions, including cancer, organ transplantation, and autoimmune conditions.  Hepatitis C  Blood testing is recommended for: ? Everyone born from 1945 through 1965. ? Anyone with known risk factors for hepatitis C.  Sexually transmitted infections (STIs)  You should be screened for sexually transmitted infections (STIs) including gonorrhea and chlamydia if: ? You are sexually active and are younger than 59 years of age. ? You are older than 59 years of age and your health care provider tells you that you are at risk for this type of infection. ? Your sexual activity has changed since you were last screened and you are at an increased risk for chlamydia or gonorrhea. Ask your health care provider if you are at risk.  If you do not have HIV, but are at risk, it may be recommended that you take a prescription medicine daily to prevent HIV infection. This is called pre-exposure prophylaxis  (PrEP). You are considered at risk if: ? You are sexually active and do not regularly use condoms or know the HIV status of your partner(s). ? You take drugs by injection. ? You are sexually active with a partner who has HIV.  Talk with your health care provider about whether you are at high risk of being infected with HIV. If you choose to begin PrEP, you should first be tested   for HIV. You should then be tested every 3 months for as long as you are taking PrEP. Pregnancy  If you are premenopausal and you may become pregnant, ask your health care provider about preconception counseling.  If you may become pregnant, take 400 to 800 micrograms (mcg) of folic acid every day.  If you want to prevent pregnancy, talk to your health care provider about birth control (contraception). Osteoporosis and menopause  Osteoporosis is a disease in which the bones lose minerals and strength with aging. This can result in serious bone fractures. Your risk for osteoporosis can be identified using a bone density scan.  If you are 28 years of age or older, or if you are at risk for osteoporosis and fractures, ask your health care provider if you should be screened.  Ask your health care provider whether you should take a calcium or vitamin D supplement to lower your risk for osteoporosis.  Menopause may have certain physical symptoms and risks.  Hormone replacement therapy may reduce some of these symptoms and risks. Talk to your health care provider about whether hormone replacement therapy is right for you. Follow these instructions at home:  Schedule regular health, dental, and eye exams.  Stay current with your immunizations.  Do not use any tobacco products including cigarettes, chewing tobacco, or electronic cigarettes.  If you are pregnant, do not drink alcohol.  If you are breastfeeding, limit how much and how often you drink alcohol.  Limit alcohol intake to no more than 1 drink per day for  nonpregnant women. One drink equals 12 ounces of beer, 5 ounces of wine, or 1 ounces of hard liquor.  Do not use street drugs.  Do not share needles.  Ask your health care provider for help if you need support or information about quitting drugs.  Tell your health care provider if you often feel depressed.  Tell your health care provider if you have ever been abused or do not feel safe at home. This information is not intended to replace advice given to you by your health care provider. Make sure you discuss any questions you have with your health care provider. Document Released: 06/20/2011 Document Revised: 05/12/2016 Document Reviewed: 09/08/2015 Elsevier Interactive Patient Education  [employer].

## 2018-10-26 LAB — CYTOLOGY - PAP
DIAGNOSIS: NEGATIVE
HPV (WINDOPATH): NOT DETECTED

## 2018-10-28 ENCOUNTER — Encounter: Payer: Self-pay | Admitting: Family Medicine

## 2018-10-30 ENCOUNTER — Encounter (HOSPITAL_BASED_OUTPATIENT_CLINIC_OR_DEPARTMENT_OTHER): Payer: Self-pay

## 2018-10-30 ENCOUNTER — Telehealth: Payer: Self-pay | Admitting: Family Medicine

## 2018-10-30 ENCOUNTER — Ambulatory Visit (HOSPITAL_BASED_OUTPATIENT_CLINIC_OR_DEPARTMENT_OTHER)
Admission: RE | Admit: 2018-10-30 | Discharge: 2018-10-30 | Disposition: A | Discharge status: Home / Self Care | Payer: BLUE CROSS/BLUE SHIELD | Source: Ambulatory Visit | Attending: Family Medicine | Admitting: Family Medicine

## 2018-10-30 DIAGNOSIS — Z1231 Encounter for screening mammogram for malignant neoplasm of breast: Secondary | ICD-10-CM | POA: Diagnosis not present

## 2018-10-30 DIAGNOSIS — Z1239 Encounter for other screening for malignant neoplasm of breast: Secondary | ICD-10-CM | POA: Diagnosis not present

## 2018-10-30 NOTE — Telephone Encounter (Signed)
Pt came in the office stating during her last visit for cpe pt left a document for provider to fill out for pt insurance at work. Pt states is needing document ASAP, please call pt at [facility] [phone] when document ready.

## 2018-10-30 NOTE — Telephone Encounter (Signed)
Document has been mailed to patient yesterday, made copy to be scanned in chart if she wants to pick up the copy, but the original is already in the mail to her.

## 2018-10-30 NOTE — Telephone Encounter (Signed)
LVM informing pt that document was mailed out yesterday. Done.

## 2018-12-06 ENCOUNTER — Other Ambulatory Visit: Payer: Self-pay | Admitting: Family Medicine

## 2018-12-06 DIAGNOSIS — D225 Melanocytic nevi of trunk: Secondary | ICD-10-CM | POA: Diagnosis not present

## 2018-12-06 DIAGNOSIS — C4401 Basal cell carcinoma of skin of lip: Secondary | ICD-10-CM | POA: Diagnosis not present

## 2018-12-06 DIAGNOSIS — L821 Other seborrheic keratosis: Secondary | ICD-10-CM | POA: Diagnosis not present

## 2018-12-14 ENCOUNTER — Telehealth: Payer: Self-pay | Admitting: *Deleted

## 2018-12-14 NOTE — Telephone Encounter (Signed)
Received Dermatopathology Report results from [facility]; forwarded to provider/SLS 12/27

## 2018-12-17 ENCOUNTER — Encounter: Payer: Self-pay | Admitting: Family Medicine

## 2018-12-17 DIAGNOSIS — D049 Carcinoma in situ of skin, unspecified: Secondary | ICD-10-CM | POA: Insufficient documentation

## 2019-01-14 DIAGNOSIS — C4401 Basal cell carcinoma of skin of lip: Secondary | ICD-10-CM | POA: Diagnosis not present

## 2019-06-23 ENCOUNTER — Other Ambulatory Visit: Payer: Self-pay | Admitting: Family Medicine

## 2019-06-28 ENCOUNTER — Other Ambulatory Visit: Payer: Self-pay

## 2019-08-28 DIAGNOSIS — S60562A Insect bite (nonvenomous) of left hand, initial encounter: Secondary | ICD-10-CM | POA: Diagnosis not present

## 2019-10-20 NOTE — Progress Notes (Addendum)
[facility] [address], [place], [place] [address] [phone]  Date:  10/28/2019   Name:  Ashley Wood   DOB:  12-29-1960   MRN:  [id number]  PCP:  Darreld Mclean, MD    Chief Complaint: Annual Exam (flu shot)   History of Present Illness:  Ashley Wood is a 58 y.o. very pleasant female patient who presents with the following:  Here today for routine physical History of skin cancer and hyperlipidemia, allergies Last seen by myself about 1 year ago for physical  She has 2 grown children and 5 grandchildren who live near [place] likes to spend as much time with her grandchildren as she can.  There was a new baby born in the last year!  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- she is able to work from home during the pandemic  Her work got a bit busier recently so it has been harder for her to have time to exercise and eat right   Flu shot- give today  Mammogram 1 year ago-we will try and do today Pap 2019- negative  Colonoscopy up-to-date Labs 1 year ago, due- she is fasting this am  Shingrix-discussed, she would like to start today She is seeing derm once a year still- she had BCC and a Mohs operation on her lip in the past   Aspirin 81 Lipitor Allegra Flonase- not always using, as needed Singulair  She is disappointed that her weight has come up a bit since this time last year.  She plans to work on this No chest pain or shortness of breath with her usual exercise routine, which is walking She has noted a bit more discomfort over her right lower ribs, in the location of a known lipoma  Wt Readings from Last 3 Encounters:  10/28/19 195 lb (88.5 kg)  10/25/18 190 lb (86.2 kg)  07/10/18 199 lb 9.6 oz (90.5 kg)     Patient Active Problem List   Diagnosis Date Noted   . 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 Hyperlipidemia 10/23/2017    Past Medical History:  Diagnosis Date  . Allergy   . Hyperlipidemia     Past Surgical History:  Procedure Laterality Date  . BLADDER NECK RECONSTRUCTION  2013    Social History   Tobacco Use  . Smoking status: Former Smoker    Packs/day: 1.00    Years: 10.00    Pack years: 10.00    Types: Cigarettes    Quit date: 10/19/1985    Years since quitting: 34.0  . Smokeless tobacco: Never Used  Substance Use Topics  . Alcohol use: Yes    Comment: 1 glass wine  every other night.  . Drug use: No    Family History  Problem Relation Age of Onset  . Hyperlipidemia Mother   . Breast cancer Mother   . Hypertension Father   . Hyperlipidemia Father   . Breast cancer Maternal Grandmother     No Known Allergies  Medication list has been reviewed and updated.  Current Outpatient Medications on File Prior to Visit  Medication Sig Dispense Refill  . aspirin 81 MG tablet Take 81 mg by mouth daily.    Marland Kitchen atorvastatin (LIPITOR) 10 MG tablet Take 1 tablet (10 mg total)  by mouth daily. 90 tablet 3  . fexofenadine (ALLEGRA ALLERGY) 180 MG tablet Take 1 tablet by mouth daily.    . fluticasone (FLONASE) 50 MCG/ACT nasal spray SPRAY 2 SPRAYS INTO EACH NOSTRIL EVERY DAY 48 g 2  . montelukast (SINGULAIR) 10 MG tablet TAKE 1 TABLET BY MOUTH EVERYDAY AT BEDTIME 90 tablet 1   No current facility-administered medications on file prior to visit.     Review of Systems:  As per HPI- otherwise negative.   Physical Examination: Vitals:   10/28/19 0830  BP: 130/88  Pulse: 67  Resp: 16  Temp: (!) 97.2 F (36.2 C)  SpO2: 98%   Vitals:   10/28/19 0830  Weight: 195 lb (88.5 kg)  Height: 5' 8.5" (1.74 m)   Body mass index is 29.22 kg/m. Ideal Body Weight: Weight in (lb) to have BMI = 25: 166.5  GEN: WDWN, NAD, Non-toxic, A & O x 3, overweight, looks well  HEENT: Atraumatic, Normocephalic. Neck  supple. No masses, No LAD.  Bilateral TM wnl, oropharynx normal.  PEERL,EOMI.   Ears and Nose: No external deformity. CV: RRR, No M/G/R. No JVD. No thrill. No extra heart sounds. PULM: CTA B, no wheezes, crackles, rhonchi. No retractions. No resp. distress. No accessory muscle use. ABD: S, NT, ND, +BS. No rebound. No HSM. EXTR: No c/c/e NEURO Normal gait.  PSYCH: Normally interactive. Conversant. Not depressed or anxious appearing.  Calm demeanor.  She does have a lipoma on her right upper abdomen, just over the lower rib border This was imaged with ultrasound in 2018 as follows:  IMPRESSION: 1. Subcutaneous 2.3 x 1.4 x 3.3 cm solid mass at the area of palpable concern as indicated by the patient in the ventral right abdominal wall, with sonographic features compatible with a lipoma. 2. Mild diffuse hepatic steatosis. 3. Tiny 3 mm gallbladder polyp versus sludge ball, for which no follow-up is required. No cholelithiasis. No biliary ductal dilatation.   Assessment and Plan: Physical exam  Screening for deficiency anemia - Plan: CBC  Hyperlipidemia, unspecified hyperlipidemia type - Plan: Lipid panel, atorvastatin (LIPITOR) 10 MG tablet  Screening for diabetes mellitus - Plan: Comprehensive metabolic panel, Hemoglobin A1c  Screening for thyroid disorder - Plan: TSH  Screening for HIV (human immunodeficiency virus) - Plan: HIV Antibody (routine testing w rflx)  Allergy, subsequent encounter - Plan: fluticasone (FLONASE) 50 MCG/ACT nasal spray, montelukast (SINGULAIR) 10 MG tablet  Encounter for screening mammogram for malignant neoplasm of breast - Plan: MM 3D SCREEN BREAST BILATERAL  Immunization due - Plan: Varicella-zoster vaccine IM (Shingrix)  Needs flu shot - Plan: Flu Vaccine QUAD 6+ mos PF IM (Fluarix Quad PF)  Routine physical exam today, labs pending as above.  Will be in touch with her results ASAP Refilled her medications Shingles vaccine #1, flu shot Plan for  mammogram Offered to have her see general surgery regarding the lipoma over her right lower ribs.  At the moment she does not wish to be referred, will let me know if she would like to take action  Will plan further follow- up pending labs. Need to complete and fax insurance form once labs arrive  Signed Lamar Blinks, MD  Received her labs, message to pt Completed and faxed insurance form  Results for orders placed or performed in visit on 10/28/19  CBC  Result Value Ref Range   WBC 6.2 4.0 - 10.5 K/uL   RBC 4.66 3.87 - 5.11 Mil/uL   Platelets 198.0 150.0 - 400.0  K/uL   Hemoglobin 13.2 12.0 - 15.0 g/dL   HCT 39.6 36.0 - 46.0 %   MCV 85.0 78.0 - 100.0 fl   MCHC 33.4 30.0 - 36.0 g/dL   RDW 14.0 11.5 - 15.5 %  Comprehensive metabolic panel  Result Value Ref Range   Sodium 140 135 - 145 mEq/L   Potassium 4.5 3.5 - 5.1 mEq/L   Chloride 105 96 - 112 mEq/L   CO2 26 19 - 32 mEq/L   Glucose, Bld 115 (H) 70 - 99 mg/dL   BUN 16 6 - 23 mg/dL   Creatinine, Ser 0.83 0.40 - 1.20 mg/dL   Total Bilirubin 0.5 0.2 - 1.2 mg/dL   Alkaline Phosphatase 100 39 - 117 U/L   AST 14 0 - 37 U/L   ALT 26 0 - 35 U/L   Total Protein 6.8 6.0 - 8.3 g/dL   Albumin 4.7 3.5 - 5.2 g/dL   GFR 69.99 >60.00 mL/min   Calcium 9.9 8.4 - 10.5 mg/dL  Hemoglobin A1c  Result Value Ref Range   Hgb A1c MFr Bld 5.6 4.6 - 6.5 %  Lipid panel  Result Value Ref Range   Cholesterol 188 0 - 200 mg/dL   Triglycerides 186.0 (H) 0.0 - 149.0 mg/dL   HDL 40.70 >39.00 mg/dL   VLDL 37.2 0.0 - 40.0 mg/dL   LDL Cholesterol 111 (H) 0 - 99 mg/dL   Total CHOL/HDL Ratio 5    NonHDL 147.74   TSH  Result Value Ref Range   TSH 1.67 0.35 - 4.50 uIU/mL

## 2019-10-20 NOTE — Patient Instructions (Addendum)
It was great to see you again today, I will be in touch with your labs ASAP You got your first shingles vaccine today- next dose due in 2-6 months I will complete and fax in your insurance form when I get your lab results  Work toward regular exercise such as walking- 30- 60 minutes most days of the week is ideal  Let me know if you do wish to see general surgery regarding the lipoma over your right lower ribs    Health Maintenance, Female Adopting a healthy lifestyle and getting preventive care are important in promoting health and wellness. Ask your health care provider about:  The right schedule for you to have regular tests and exams.  Things you can do on your own to prevent diseases and keep yourself healthy. What should I know about diet, weight, and exercise? Eat a healthy diet   Eat a diet that includes plenty of vegetables, fruits, low-fat dairy products, and lean protein.  Do not eat a lot of foods that are high in solid fats, added sugars, or sodium. Maintain a healthy weight Body mass index (BMI) is used to identify weight problems. It estimates body fat based on height and weight. Your health care provider can help determine your BMI and help you achieve or maintain a healthy weight. Get regular exercise Get regular exercise. This is one of the most important things you can do for your health. Most adults should:  Exercise for at least 150 minutes each week. The exercise should increase your heart rate and make you sweat (moderate-intensity exercise).  Do strengthening exercises at least twice a week. This is in addition to the moderate-intensity exercise.  Spend less time sitting. Even light physical activity can be beneficial. Watch cholesterol and blood lipids Have your blood tested for lipids and cholesterol at 60 years of age, then have this test every 5 years. Have your cholesterol levels checked more often if:  Your lipid or cholesterol levels are high.  You  are older than 60 years of age.  You are at high risk for heart disease. What should I know about cancer screening? Depending on your health history and family history, you may need to have cancer screening at various ages. This may include screening for:  Breast cancer.  Cervical cancer.  Colorectal cancer.  Skin cancer.  Lung cancer. What should I know about heart disease, diabetes, and high blood pressure? Blood pressure and heart disease  High blood pressure causes heart disease and increases the risk of stroke. This is more likely to develop in people who have high blood pressure readings, are of African descent, or are overweight.  Have your blood pressure checked: ? Every 3-5 years if you are 84-54 years of age. ? Every year if you are 35 years old or older. Diabetes Have regular diabetes screenings. This checks your fasting blood sugar level. Have the screening done:  Once every three years after age 66 if you are at a normal weight and have a low risk for diabetes.  More often and at a younger age if you are overweight or have a high risk for diabetes. What should I know about preventing infection? Hepatitis B If you have a higher risk for hepatitis B, you should be screened for this virus. Talk with your health care provider to find out if you are at risk for hepatitis B infection. Hepatitis C Testing is recommended for:  Everyone born from [birth year] through 1967.  Anyone with  known risk factors for hepatitis C. Sexually transmitted infections (STIs)  Get screened for STIs, including gonorrhea and chlamydia, if: ? You are sexually active and are younger than 60 years of age. ? You are older than 60 years of age and your health care provider tells you that you are at risk for this type of infection. ? Your sexual activity has changed since you were last screened, and you are at increased risk for chlamydia or gonorrhea. Ask your health care provider if you are at risk.   Ask your health care provider about whether you are at high risk for HIV. Your health care provider may recommend a prescription medicine to help prevent HIV infection. If you choose to take medicine to prevent HIV, you should first get tested for HIV. You should then be tested every 3 months for as long as you are taking the medicine. Pregnancy  If you are about to stop having your period (premenopausal) and you may become pregnant, seek counseling before you get pregnant.  Take 400 to 800 micrograms (mcg) of folic acid every day if you become pregnant.  Ask for birth control (contraception) if you want to prevent pregnancy. Osteoporosis and menopause Osteoporosis is a disease in which the bones lose minerals and strength with aging. This can result in bone fractures. If you are 15 years old or older, or if you are at risk for osteoporosis and fractures, ask your health care provider if you should:  Be screened for bone loss.  Take a calcium or vitamin D supplement to lower your risk of fractures.  Be given hormone replacement therapy (HRT) to treat symptoms of menopause. Follow these instructions at home: Lifestyle  Do not use any products that contain nicotine or tobacco, such as cigarettes, e-cigarettes, and chewing tobacco. If you need help quitting, ask your health care provider.  Do not use street drugs.  Do not share needles.  Ask your health care provider for help if you need support or information about quitting drugs. Alcohol use  Do not drink alcohol if: ? Your health care provider tells you not to drink. ? You are pregnant, may be pregnant, or are planning to become pregnant.  If you drink alcohol: ? Limit how much you use to 0-1 drink a day. ? Limit intake if you are breastfeeding.  Be aware of how much alcohol is in your drink. In the [place] one drink equals one 12 oz bottle of beer (355 mL), one 5 oz glass of wine (148 mL), or one 1 oz glass of hard liquor (44  mL). General instructions  Schedule regular health, dental, and eye exams.  Stay current with your vaccines.  Tell your health care provider if: ? You often feel depressed. ? You have ever been abused or do not feel safe at home. Summary  Adopting a healthy lifestyle and getting preventive care are important in promoting health and wellness.  Follow your health care provider's instructions about healthy diet, exercising, and getting tested or screened for diseases.  Follow your health care provider's instructions on monitoring your cholesterol and blood pressure. This information is not intended to replace advice given to you by your health care provider. Make sure you discuss any questions you have with your health care provider. Document Released: 06/20/2011 Document Revised: 11/28/2018 Document Reviewed: 11/28/2018 Elsevier Patient Education  2020 [employer].

## 2019-10-28 ENCOUNTER — Encounter: Payer: Self-pay | Admitting: Family Medicine

## 2019-10-28 ENCOUNTER — Ambulatory Visit (HOSPITAL_BASED_OUTPATIENT_CLINIC_OR_DEPARTMENT_OTHER)
Admission: RE | Admit: 2019-10-28 | Discharge: 2019-10-28 | Disposition: A | Discharge status: Home / Self Care | Payer: BC Managed Care – PPO | Source: Ambulatory Visit | Attending: Family Medicine | Admitting: Family Medicine

## 2019-10-28 ENCOUNTER — Encounter (HOSPITAL_BASED_OUTPATIENT_CLINIC_OR_DEPARTMENT_OTHER): Payer: Self-pay

## 2019-10-28 ENCOUNTER — Ambulatory Visit (INDEPENDENT_AMBULATORY_CARE_PROVIDER_SITE_OTHER): Payer: BC Managed Care – PPO | Admitting: Family Medicine

## 2019-10-28 ENCOUNTER — Other Ambulatory Visit: Payer: Self-pay

## 2019-10-28 VITALS — BP 120/78 | HR 67 | Temp 97.2°F | Resp 16 | Ht 68.5 in | Wt 195.0 lb

## 2019-10-28 DIAGNOSIS — E785 Hyperlipidemia, unspecified: Secondary | ICD-10-CM | POA: Diagnosis not present

## 2019-10-28 DIAGNOSIS — Z1329 Encounter for screening for other suspected endocrine disorder: Secondary | ICD-10-CM | POA: Diagnosis not present

## 2019-10-28 DIAGNOSIS — Z1231 Encounter for screening mammogram for malignant neoplasm of breast: Secondary | ICD-10-CM | POA: Diagnosis not present

## 2019-10-28 DIAGNOSIS — Z131 Encounter for screening for diabetes mellitus: Secondary | ICD-10-CM

## 2019-10-28 DIAGNOSIS — Z0000 Encounter for general adult medical examination without abnormal findings: Secondary | ICD-10-CM

## 2019-10-28 DIAGNOSIS — Z23 Encounter for immunization: Secondary | ICD-10-CM | POA: Diagnosis not present

## 2019-10-28 DIAGNOSIS — Z130 Encounter for screening for diseases of the blood and blood-forming organs and certain disorders involving the immune mechanism: Secondary | ICD-10-CM | POA: Diagnosis not present

## 2019-10-28 DIAGNOSIS — Z114 Encounter for screening for human immunodeficiency virus [HIV]: Secondary | ICD-10-CM

## 2019-10-28 DIAGNOSIS — T7840XD Allergy, unspecified, subsequent encounter: Secondary | ICD-10-CM

## 2019-10-28 LAB — CBC
HCT: 39.6 % (ref 36.0–46.0)
Hemoglobin: 13.2 g/dL (ref 12.0–15.0)
MCHC: 33.4 g/dL (ref 30.0–36.0)
MCV: 85 fl (ref 78.0–100.0)
Platelets: 198 10*3/uL (ref 150.0–400.0)
RBC: 4.66 Mil/uL (ref 3.87–5.11)
RDW: 14 % (ref 11.5–15.5)
WBC: 6.2 10*3/uL (ref 4.0–10.5)

## 2019-10-28 LAB — COMPREHENSIVE METABOLIC PANEL
ALT: 26 U/L (ref 0–35)
AST: 14 U/L (ref 0–37)
Albumin: 4.7 g/dL (ref 3.5–5.2)
Alkaline Phosphatase: 100 U/L (ref 39–117)
BUN: 16 mg/dL (ref 6–23)
CO2: 26 mEq/L (ref 19–32)
Calcium: 9.9 mg/dL (ref 8.4–10.5)
Chloride: 105 mEq/L (ref 96–112)
Creatinine, Ser: 0.83 mg/dL (ref 0.40–1.20)
GFR: 69.99 mL/min (ref >60.00)
Glucose, Bld: 115 mg/dL — ABNORMAL HIGH (ref 70–99)
Potassium: 4.5 mEq/L (ref 3.5–5.1)
Sodium: 140 mEq/L (ref 135–145)
Total Bilirubin: 0.5 mg/dL (ref 0.2–1.2)
Total Protein: 6.8 g/dL (ref 6.0–8.3)

## 2019-10-28 LAB — LIPID PANEL
Cholesterol: 188 mg/dL (ref 0–200)
HDL: 40.7 mg/dL (ref >39.00)
LDL Cholesterol: 111 mg/dL — ABNORMAL HIGH (ref 0–99)
NonHDL: 147.74
Total CHOL/HDL Ratio: 5
Triglycerides: 186 mg/dL — ABNORMAL HIGH (ref 0.0–149.0)
VLDL: 37.2 mg/dL (ref 0.0–40.0)

## 2019-10-28 LAB — TSH: TSH: 1.67 u[IU]/mL (ref 0.35–4.50)

## 2019-10-28 LAB — HEMOGLOBIN A1C: Hgb A1c MFr Bld: 5.6 % (ref 4.6–6.5)

## 2019-10-28 MED ORDER — FLUTICASONE PROPIONATE 50 MCG/ACT NA SUSP: NASAL | 2 refills | Status: AC

## 2019-10-28 MED ORDER — ATORVASTATIN CALCIUM 10 MG PO TABS: 10.0000 mg | ORAL_TABLET | Freq: Every day | ORAL | 3 refills | Status: DC

## 2019-10-28 MED ORDER — MONTELUKAST SODIUM 10 MG PO TABS: ORAL_TABLET | ORAL | 3 refills | Status: DC

## 2019-10-29 LAB — HIV ANTIBODY (ROUTINE TESTING W REFLEX): HIV 1&2 Ab, 4th Generation: NONREACTIVE

## 2019-11-07 ENCOUNTER — Encounter: Payer: Self-pay | Admitting: Family Medicine

## 2019-11-08 ENCOUNTER — Encounter: Payer: Self-pay | Admitting: Family Medicine

## 2019-11-11 ENCOUNTER — Encounter: Payer: Self-pay | Admitting: Family Medicine

## 2020-02-08 DIAGNOSIS — Z03818 Encounter for observation for suspected exposure to other biological agents ruled out: Secondary | ICD-10-CM | POA: Diagnosis not present

## 2020-02-08 DIAGNOSIS — Z20828 Contact with and (suspected) exposure to other viral communicable diseases: Secondary | ICD-10-CM | POA: Diagnosis not present

## 2020-04-16 DIAGNOSIS — Z23 Encounter for immunization: Secondary | ICD-10-CM | POA: Diagnosis not present

## 2020-05-07 DIAGNOSIS — Z23 Encounter for immunization: Secondary | ICD-10-CM | POA: Diagnosis not present

## 2020-06-07 IMAGING — MG DIGITAL SCREENING BILAT W/ TOMO W/ CAD
8 series · 8 of 24 positions shown · non-contrast
Comparison: Previous exam(s).

CLINICAL DATA: Screening.

EXAM:
DIGITAL SCREENING BILATERAL MAMMOGRAM WITH TOMO AND CAD

[L MLO synth-2D]
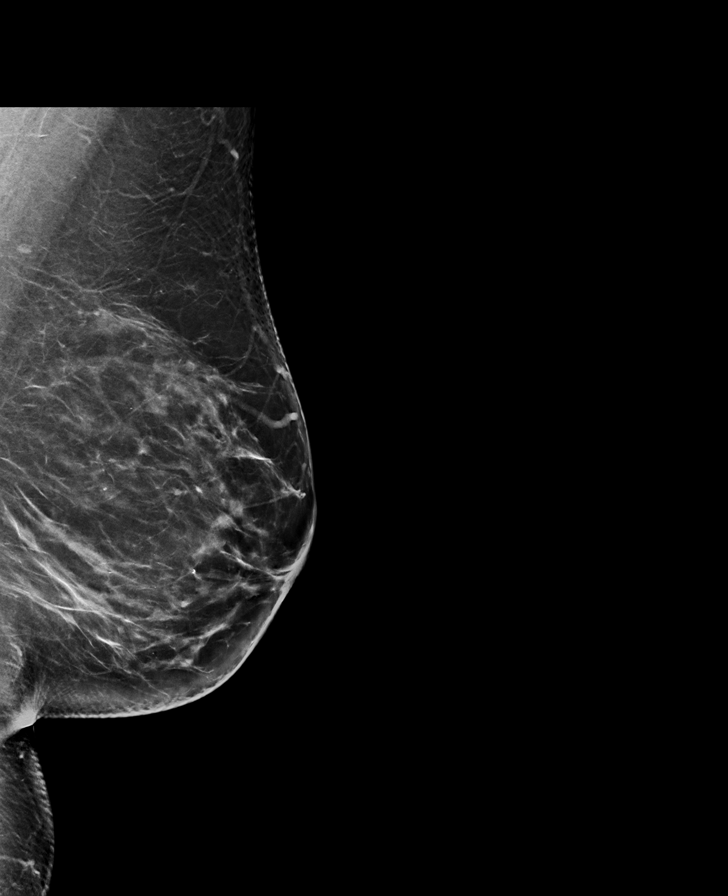

[R MLO synth-2D]
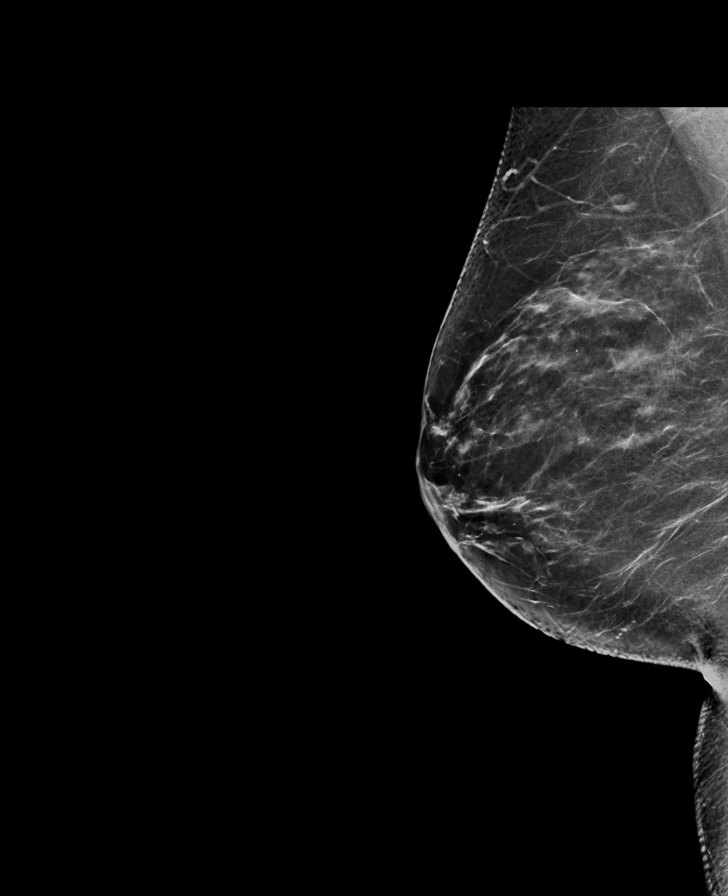

[R CC synth-2D]
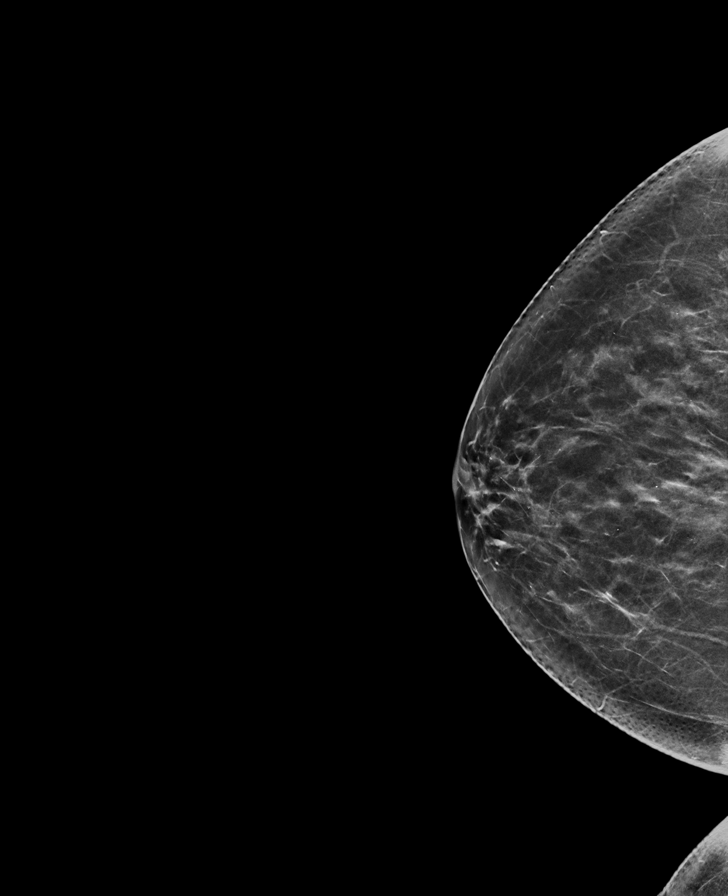

[L CC synth-2D]
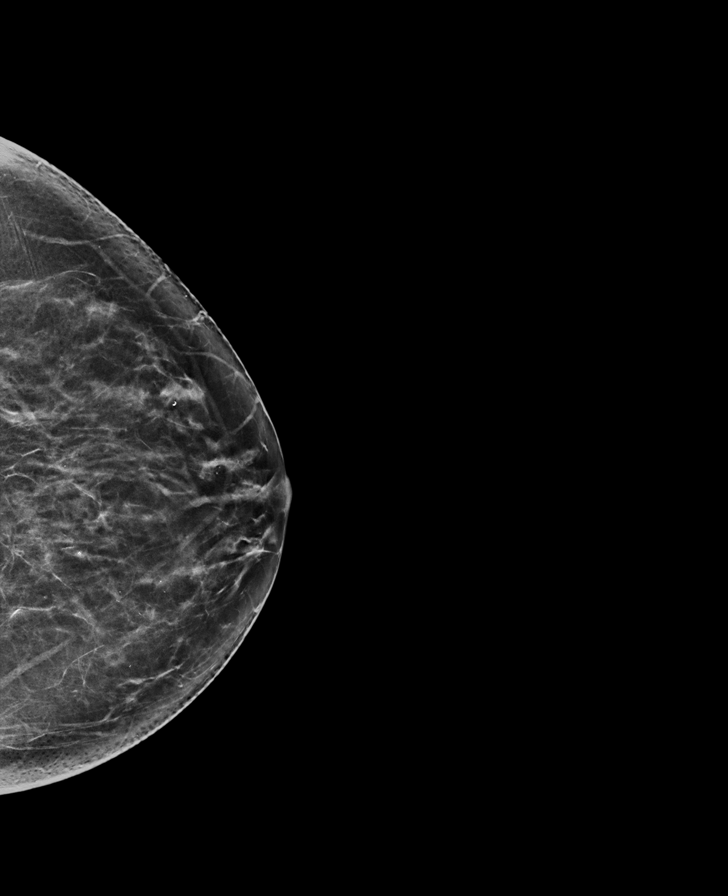

[R CC tomo · tomo slice 33/66.0]
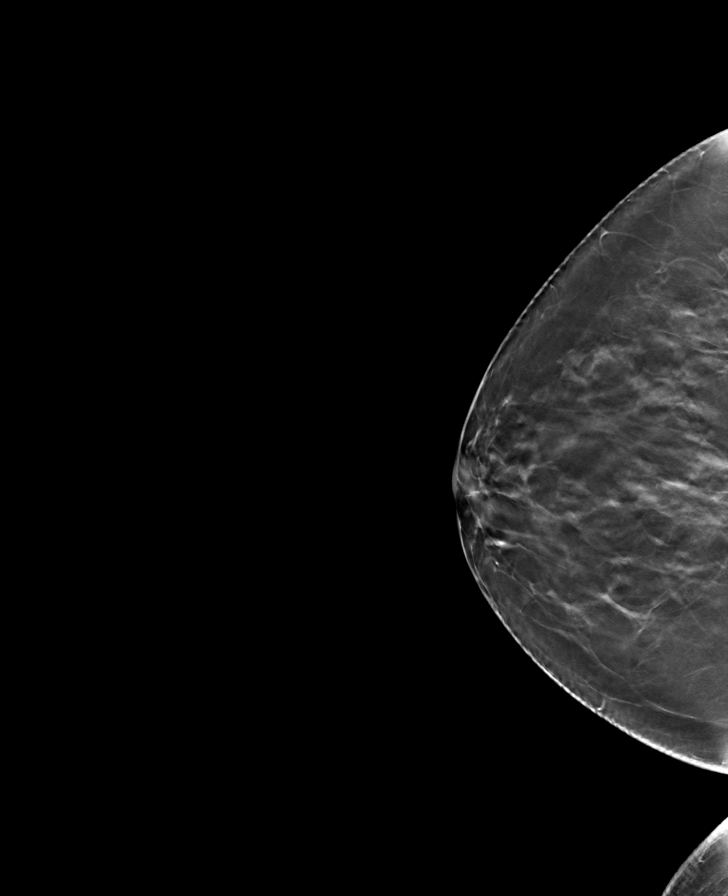

[L CC tomo · tomo slice 36/71.0]
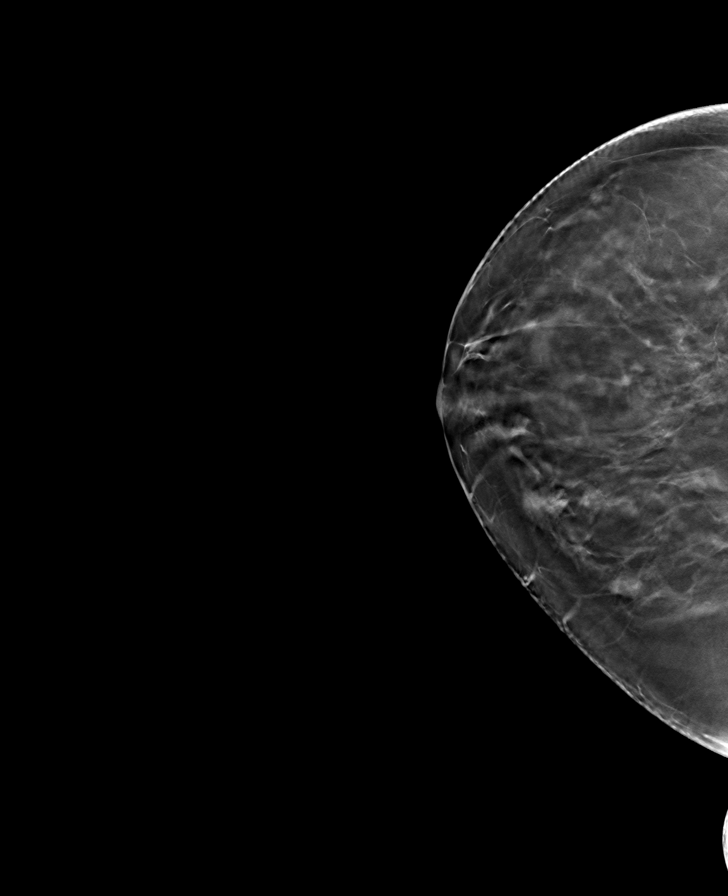

[R MLO tomo · tomo slice 38/75.0]
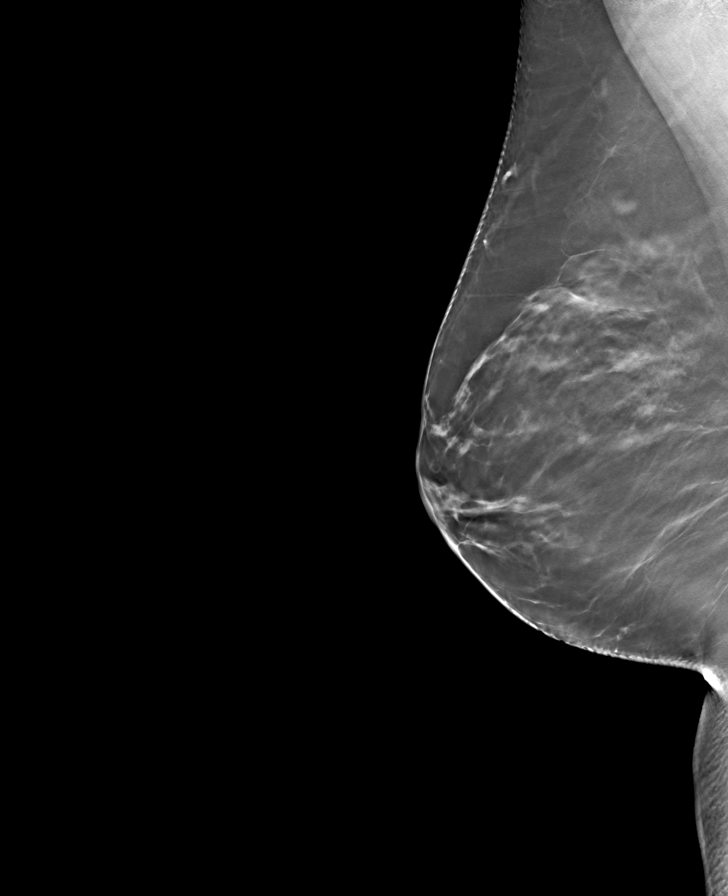

[L MLO tomo · tomo slice 42/83.0]
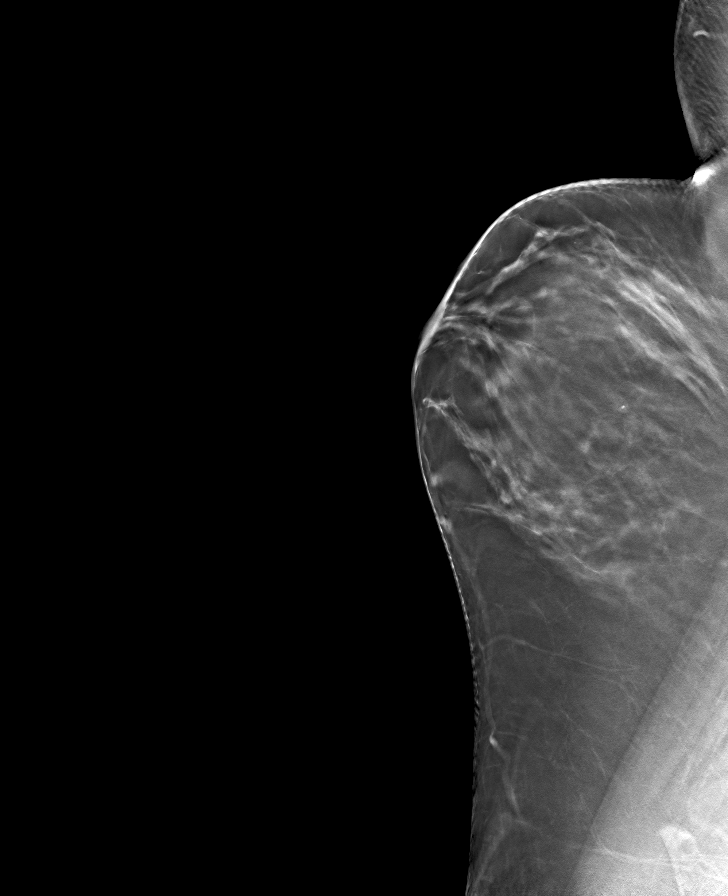

[8 of 24 positions shown; findings below may reference images not displayed]

ACR Breast Density Category b: There are scattered areas of
fibroglandular density.
FINDINGS: There are no findings suspicious for malignancy. Images were
processed with CAD.
IMPRESSION: No mammographic evidence of malignancy. A result letter of this
screening mammogram will be mailed directly to the patient.

RECOMMENDATION:
Screening mammogram in one year. (Code:[id number])

BI-RADS CATEGORY  1: Negative.

## 2020-10-27 NOTE — Patient Instructions (Signed)
It was great to see you again today Flu shot given today I will be in touch with your labs soon as possible  I am concerned about the bloody nipple discharge you have noticed.  Please call the [facility] today and see if you can set up a diagnostic mammogram and ultrasound that works for your schedule phone: [phone]  If you are not able to do this while in [place], we will need to find a breast center near where you live and set this up Also, I would suggest seeing either a general surgeon or plastic surgeon about your lipomas for consultation-let me know if you need a referral  We also might consider physical therapy for your back.  Please let me know where you might like to be seen for PT near your home   Health Maintenance, Female Adopting a healthy lifestyle and getting preventive care are important in promoting health and wellness. Ask your health care provider about:  The right schedule for you to have regular tests and exams.  Things you can do on your own to prevent diseases and keep yourself healthy. What should I know about diet, weight, and exercise? Eat a healthy diet   Eat a diet that includes plenty of vegetables, fruits, low-fat dairy products, and lean protein.  Do not eat a lot of foods that are high in solid fats, added sugars, or sodium. Maintain a healthy weight Body mass index (BMI) is used to identify weight problems. It estimates body fat based on height and weight. Your health care provider can help determine your BMI and help you achieve or maintain a healthy weight. Get regular exercise Get regular exercise. This is one of the most important things you can do for your health. Most adults should:  Exercise for at least 150 minutes each week. The exercise should increase your heart rate and make you sweat (moderate-intensity exercise).  Do strengthening exercises at least twice a week. This is in addition to the moderate-intensity  exercise.  Spend less time sitting. Even light physical activity can be beneficial. Watch cholesterol and blood lipids Have your blood tested for lipids and cholesterol at 61 years of age, then have this test every 5 years. Have your cholesterol levels checked more often if:  Your lipid or cholesterol levels are high.  You are older than 61 years of age.  You are at high risk for heart disease. What should I know about cancer screening? Depending on your health history and family history, you may need to have cancer screening at various ages. This may include screening for:  Breast cancer.  Cervical cancer.  Colorectal cancer.  Skin cancer.  Lung cancer. What should I know about heart disease, diabetes, and high blood pressure? Blood pressure and heart disease  High blood pressure causes heart disease and increases the risk of stroke. This is more likely to develop in people who have high blood pressure readings, are of African descent, or are overweight.  Have your blood pressure checked: ? Every 3-5 years if you are 37-61 years of age. ? Every year if you are 50 years old or older. Diabetes Have regular diabetes screenings. This checks your fasting blood sugar level. Have the screening done:  Once every three years after age 55 if you are at a normal weight and have a low risk for diabetes.  More often and at a younger age if you are overweight or have a high risk for diabetes.  What should I know about preventing infection? Hepatitis B If you have a higher risk for hepatitis B, you should be screened for this virus. Talk with your health care provider to find out if you are at risk for hepatitis B infection. Hepatitis C Testing is recommended for:  Everyone born from [birth year] through 1965.  Anyone with known risk factors for hepatitis C. Sexually transmitted infections (STIs)  Get screened for STIs, including gonorrhea and chlamydia, if: ? You are sexually active and  are younger than 61 years of age. ? You are older than 61 years of age and your health care provider tells you that you are at risk for this type of infection. ? Your sexual activity has changed since you were last screened, and you are at increased risk for chlamydia or gonorrhea. Ask your health care provider if you are at risk.  Ask your health care provider about whether you are at high risk for HIV. Your health care provider may recommend a prescription medicine to help prevent HIV infection. If you choose to take medicine to prevent HIV, you should first get tested for HIV. You should then be tested every 3 months for as long as you are taking the medicine. Pregnancy  If you are about to stop having your period (premenopausal) and you may become pregnant, seek counseling before you get pregnant.  Take 400 to 800 micrograms (mcg) of folic acid every day if you become pregnant.  Ask for birth control (contraception) if you want to prevent pregnancy. Osteoporosis and menopause Osteoporosis is a disease in which the bones lose minerals and strength with aging. This can result in bone fractures. If you are 16 years old or older, or if you are at risk for osteoporosis and fractures, ask your health care provider if you should:  Be screened for bone loss.  Take a calcium or vitamin D supplement to lower your risk of fractures.  Be given hormone replacement therapy (HRT) to treat symptoms of menopause. Follow these instructions at home: Lifestyle  Do not use any products that contain nicotine or tobacco, such as cigarettes, e-cigarettes, and chewing tobacco. If you need help quitting, ask your health care provider.  Do not use street drugs.  Do not share needles.  Ask your health care provider for help if you need support or information about quitting drugs. Alcohol use  Do not drink alcohol if: ? Your health care provider tells you not to drink. ? You are pregnant, may be pregnant,  or are planning to become pregnant.  If you drink alcohol: ? Limit how much you use to 0-1 drink a day. ? Limit intake if you are breastfeeding.  Be aware of how much alcohol is in your drink. In the [place] one drink equals one 12 oz bottle of beer (355 mL), one 5 oz glass of wine (148 mL), or one 1 oz glass of hard liquor (44 mL). General instructions  Schedule regular health, dental, and eye exams.  Stay current with your vaccines.  Tell your health care provider if: ? You often feel depressed. ? You have ever been abused or do not feel safe at home. Summary  Adopting a healthy lifestyle and getting preventive care are important in promoting health and wellness.  Follow your health care provider's instructions about healthy diet, exercising, and getting tested or screened for diseases.  Follow your health care provider's instructions on monitoring your cholesterol and blood pressure. This information is not intended to  replace advice given to you by your health care provider. Make sure you discuss any questions you have with your health care provider. Document Revised: 11/28/2018 Document Reviewed: 11/28/2018 Elsevier Patient Education  2020 [employer].

## 2020-10-27 NOTE — Progress Notes (Deleted)
[place] at [facility] [address] [place], [place] [address] [phone]  Date:  10/29/2020   Name:  Ashley Wood   DOB:  April 02, 1961   MRN:  [id number]  PCP:  Darreld Mclean, MD    Chief Complaint: No chief complaint on file.   History of Present Illness:  Ashley Wood is a 59 y.o. very pleasant female patient who presents with the following:  Patient with history of allergies, skin cancer, hyperlipidemia here today for physical exam Last seen by myself about 1 year ago for physical  She has 2 grown children, 5 grandchildren who live near [place] enjoys spending time with them as much as possible She is actually living near [place] now She works as an Leisure centre manager type work  Flu vaccine Pap up-to-date Mammogram now due [place] screening up-to-date Shingrix done COVID-19 done Most recent lab work 1 year ago Dermatology follow-up  Aspirin 81 Lipitor 10 [place] Patient Active Problem List   Diagnosis Date Noted  . 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 Hyperlipidemia 10/23/2017    Past Medical History:  Diagnosis Date  . Allergy   . Hyperlipidemia     Past Surgical History:  Procedure Laterality Date  . BLADDER NECK RECONSTRUCTION  2013    Social History   Tobacco Use  . Smoking status: Former Smoker    Packs/day: 1.00    Years: 10.00    Pack years: 10.00    Types: Cigarettes    Quit date: 10/19/1985    Years since quitting: 35.0  . Smokeless tobacco: Never Used  Substance Use Topics  . Alcohol use: Yes    Comment: 1 glass wine  every other night.  . Drug use: No    Family History  Problem Relation Age of Onset  . Hyperlipidemia Mother   . Breast cancer Mother   . Hypertension Father   . Hyperlipidemia Father   . Breast cancer Maternal Grandmother     No Known Allergies  Medication list  has been reviewed and updated.  Current Outpatient Medications on File Prior to Visit  Medication Sig Dispense Refill  . aspirin 81 MG tablet Take 81 mg by mouth daily.    Marland Kitchen atorvastatin (LIPITOR) 10 MG tablet Take 1 tablet (10 mg total) by mouth daily. 90 tablet 3  . fexofenadine (ALLEGRA ALLERGY) 180 MG tablet Take 1 tablet by mouth daily.    . fluticasone (FLONASE) 50 MCG/ACT nasal spray Use 2 sprays in nose daily for allergies 48 g 2  . montelukast (SINGULAIR) 10 MG tablet TAKE 1 TABLET BY MOUTH EVERYDAY AT BEDTIME 90 tablet 3   No current facility-administered medications on file prior to visit.    Review of Systems:  As per HPI- otherwise negative.   Physical Examination: There were no vitals filed for this visit. There were no vitals filed for this visit. There is no height or weight on file to calculate BMI. Ideal Body Weight:    GEN: no acute distress. HEENT: Atraumatic, Normocephalic.  Ears and Nose: No external deformity. CV: RRR, No M/G/R. No JVD. No thrill. No extra heart sounds. PULM: CTA B, no wheezes, crackles, rhonchi. No retractions. No resp. distress. No accessory muscle use. ABD: S, NT, ND, +BS. No rebound. No HSM. EXTR: No c/c/e PSYCH: Normally interactive. Conversant.    Assessment and Plan: *** This visit occurred during the SARS-CoV-2  public health emergency.  Safety protocols were in place, including screening questions prior to the visit, additional usage of staff PPE, and extensive cleaning of exam room while observing appropriate contact time as indicated for disinfecting solutions.    Signed Lamar Blinks, MD

## 2020-10-29 ENCOUNTER — Other Ambulatory Visit: Payer: Self-pay

## 2020-10-29 ENCOUNTER — Ambulatory Visit (INDEPENDENT_AMBULATORY_CARE_PROVIDER_SITE_OTHER): Payer: BC Managed Care – PPO | Admitting: Family Medicine

## 2020-10-29 ENCOUNTER — Encounter: Payer: Self-pay | Admitting: Family Medicine

## 2020-10-29 VITALS — BP 138/80 | HR 77 | Resp 17 | Ht 68.5 in | Wt 200.0 lb

## 2020-10-29 DIAGNOSIS — Z131 Encounter for screening for diabetes mellitus: Secondary | ICD-10-CM | POA: Diagnosis not present

## 2020-10-29 DIAGNOSIS — R5383 Other fatigue: Secondary | ICD-10-CM | POA: Diagnosis not present

## 2020-10-29 DIAGNOSIS — Z0000 Encounter for general adult medical examination without abnormal findings: Secondary | ICD-10-CM | POA: Diagnosis not present

## 2020-10-29 DIAGNOSIS — Z130 Encounter for screening for diseases of the blood and blood-forming organs and certain disorders involving the immune mechanism: Secondary | ICD-10-CM | POA: Diagnosis not present

## 2020-10-29 DIAGNOSIS — Z1321 Encounter for screening for nutritional disorder: Secondary | ICD-10-CM

## 2020-10-29 DIAGNOSIS — E785 Hyperlipidemia, unspecified: Secondary | ICD-10-CM

## 2020-10-29 DIAGNOSIS — M5136 Other intervertebral disc degeneration, lumbar region: Secondary | ICD-10-CM

## 2020-10-29 DIAGNOSIS — D179 Benign lipomatous neoplasm, unspecified: Secondary | ICD-10-CM

## 2020-10-29 DIAGNOSIS — Z23 Encounter for immunization: Secondary | ICD-10-CM

## 2020-10-29 DIAGNOSIS — Z1329 Encounter for screening for other suspected endocrine disorder: Secondary | ICD-10-CM

## 2020-10-29 DIAGNOSIS — N6452 Nipple discharge: Secondary | ICD-10-CM

## 2020-10-29 LAB — COMPREHENSIVE METABOLIC PANEL
ALT: 31 U/L (ref 0–35)
AST: 16 U/L (ref 0–37)
Albumin: 4.8 g/dL (ref 3.5–5.2)
Alkaline Phosphatase: 98 U/L (ref 39–117)
BUN: 19 mg/dL (ref 6–23)
CO2: 29 mEq/L (ref 19–32)
Calcium: 10.2 mg/dL (ref 8.4–10.5)
Chloride: 103 mEq/L (ref 96–112)
Creatinine, Ser: 0.83 mg/dL (ref 0.40–1.20)
GFR: 76.01 mL/min (ref >60.00)
Glucose, Bld: 111 mg/dL — ABNORMAL HIGH (ref 70–99)
Potassium: 4.5 mEq/L (ref 3.5–5.1)
Sodium: 140 mEq/L (ref 135–145)
Total Bilirubin: 0.6 mg/dL (ref 0.2–1.2)
Total Protein: 7 g/dL (ref 6.0–8.3)

## 2020-10-29 LAB — TSH: TSH: 2.66 u[IU]/mL (ref 0.35–4.50)

## 2020-10-29 LAB — CBC
HCT: 39.4 % (ref 36.0–46.0)
Hemoglobin: 13.5 g/dL (ref 12.0–15.0)
MCHC: 34.2 g/dL (ref 30.0–36.0)
MCV: 84.8 fl (ref 78.0–100.0)
Platelets: 195 10*3/uL (ref 150.0–400.0)
RBC: 4.64 Mil/uL (ref 3.87–5.11)
RDW: 14.1 % (ref 11.5–15.5)
WBC: 5.8 10*3/uL (ref 4.0–10.5)

## 2020-10-29 LAB — LIPID PANEL
Cholesterol: 190 mg/dL (ref 0–200)
HDL: 45.6 mg/dL (ref >39.00)
LDL Cholesterol: 104 mg/dL — ABNORMAL HIGH (ref 0–99)
NonHDL: 144.23
Total CHOL/HDL Ratio: 4
Triglycerides: 199 mg/dL — ABNORMAL HIGH (ref 0.0–149.0)
VLDL: 39.8 mg/dL (ref 0.0–40.0)

## 2020-10-29 LAB — VITAMIN D 25 HYDROXY (VIT D DEFICIENCY, FRACTURES): VITD: 28.45 ng/mL — ABNORMAL LOW (ref 30.00–100.00)

## 2020-10-29 LAB — HEMOGLOBIN A1C: Hgb A1c MFr Bld: 5.6 % (ref 4.6–6.5)

## 2020-10-29 NOTE — Progress Notes (Addendum)
[place] at [facility] [place], [place], [place] [address] [phone]  Date:  10/29/2020   Name:  Ashley Wood   DOB:  1961/03/27   MRN:  [id number]  PCP:  Darreld Mclean, MD    Chief Complaint: Annual Exam   History of Present Illness:  Ashley Wood is a 59 y.o. 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  See further details below Colon cancer screening up-to-date Shingrix done COVID-19 done- mentioned booster Most recent lab work 1 year ago Dermatology follow-up; she does follow-up on a regular basis  Aspirin 81 Lipitor 10 Allegra Flonase Singulair  She has a history of degen back disease- she notes imaging in the past.  It appears she had plain lumbar films in 2014 which showed significant degenerative disease in her back She has noted more back pain as of late She does spend a lot of time sitting and has gained a bit of weight which seems to be exacerbating her symptoms She has been using more ibuprofen recently  Lumbar spine 4V 2014: HISTORY: Lower back pain  LUMBAR SPINE WITH OBLIQUE VIEWS  The lumbar vertebral body heights are maintained. Moderate to severe disc  space narrowing at all levels. 3 mm retrolisthesis of L3 on L4. Alignment   is otherwise maintained. Negative for fracture. Sacroiliac joints are  maintained.  No numbness or weakness of her legs No saddle anesthesia or new incontinence symptoms  She has had a bladder tack   Today, patient also mentions that she has had 3 episodes of bloody discharge from her left nipple over the last year.  Also, she has noted feeling of discomfort in the left axilla and also discomfort in the left breast around the time when these episodes of bleeding occur Wt Readings from Last 3 Encounters:  10/29/20 200 lb (90.7 kg)  10/28/19 195 lb (88.5 kg)  10/25/18 190 lb (86.2 kg)    Patient Active Problem List   Diagnosis Date Noted  . 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 Hyperlipidemia 10/23/2017    Past Medical History:  Diagnosis Date  . Allergy   . Hyperlipidemia     Past Surgical History:  Procedure Laterality Date  . BLADDER NECK RECONSTRUCTION  2013    Social History   Tobacco Use  . Smoking status: Former Smoker    Packs/day: 1.00    Years: 10.00    Pack years: 10.00    Types: Cigarettes    Quit date: 10/19/1985    Years since quitting: 35.0  . Smokeless tobacco: Never Used  Substance Use Topics  . Alcohol use: Yes    Comment: 1 glass wine  every other night.  . Drug use: No    Family History  Problem Relation Age of Onset  . Hyperlipidemia Mother   . Breast cancer Mother   . Hypertension Father   . Hyperlipidemia Father   . Breast cancer Maternal Grandmother     No Known Allergies  Medication list has been reviewed and updated.  Current Outpatient Medications on File Prior to Visit  Medication Sig Dispense Refill  . aspirin 81 MG tablet Take 81 mg by mouth daily.    Marland Kitchen atorvastatin (LIPITOR) 10 MG tablet Take 1 tablet (10 mg total) by mouth daily. 90 tablet 3  . fexofenadine (ALLEGRA ALLERGY) 180 MG tablet Take 1 tablet by mouth daily.    . fluticasone (FLONASE) 50 MCG/ACT nasal spray Use 2 sprays in nose daily for  allergies 48 g 2  . montelukast (SINGULAIR) 10 MG tablet TAKE 1 TABLET BY MOUTH EVERYDAY AT BEDTIME 90 tablet 3   No current facility-administered medications on file prior to visit.    Review of Systems:  As per HPI- otherwise negative.   Physical Examination: Vitals:   10/29/20 0837  BP: 138/80  Pulse: 77  Resp: 17  SpO2: 98%   Vitals:   10/29/20 0837  Weight: 200 lb (90.7 kg)  Height: 5' 8.5" (1.74 m)   Body mass index is 29.97 kg/m. Ideal Body Weight: Weight in (lb) to have BMI = 25: 166.5  GEN: no acute distress.  Overweight, looks well  HEENT: Atraumatic, Normocephalic.  Ears and Nose: No external deformity. CV: RRR, No M/G/R. No JVD. No thrill. No extra heart sounds. PULM: CTA B, no wheezes, crackles, rhonchi. No retractions. No resp. distress. No accessory muscle use. ABD: S, NT, ND, +BS. No rebound. No HSM. EXTR: No c/c/e PSYCH: Normally interactive. Conversant.  Lumbar back: Patient indicates the paraspinous muscles in the lumbar area as her site of concern.  She has good thoracolumbar flexion and extension.  Normal bilateral lower extremity strength, sensation, deep tendon reflex.  Negative straight leg raise Breast exam is normal bilaterally She also has a few lipomas, one at the right bra line on her back is quite large-it is getting larger and becoming more bothersome.  She also has 1 in the right upper quadrant which we have ultrasounded in the past, and one at the base of her cervical spine which is new  Assessment and Plan: Physical exam  Screening for deficiency anemia - Plan: CBC  Hyperlipidemia, unspecified hyperlipidemia type - Plan: Lipid panel  Screening for diabetes mellitus - Plan: Comprehensive metabolic panel, Hemoglobin A1c  Screening for thyroid disorder - Plan: TSH  Encounter for vitamin deficiency screening - Plan: VITAMIN D 25 Hydroxy (Vit-D Deficiency, Fractures)  Other fatigue - Plan: VITAMIN D 25 Hydroxy (Vit-D Deficiency,  Fractures)  Bloody discharge from left nipple - Plan: MM DIAG BREAST TOMO BILATERAL, US BREAST COMPLETE UNI LEFT INC AXILLA  Needs flu shot - Plan: CANCELED: Flu Vaccine QUAD High Dose(Fluad)  Multiple lipomas  Degenerative disc disease, lumbar  Patient today for physical exam, also has a couple of concerns Encouraged healthy diet and exercise routine, discussed health maintenance  She has noted worsening back pain over the last several months.  No red flag symptoms.  She showed evidence of significant degenerative disease on x-ray in 2014, suspect this has only gotten worse.  For the time being she is not interested in any aggressive intervention, but she might like to try physical therapy.  She lives in [facility], will let me know where she might like to be seen  for PT She also plans to consult with a plastic or general surgeon about her lipomas Explained to patient that bloody nipple discharge can be evidence of cancer.  Need to get a diagnostic mammogram as soon as possible.  Again, this is somewhat complicated by the patient not living in [place] any longer.  I ordered diagnostic mammogram and left breast ultrasound at breast center of [place] [facility]-we hope patient can get this done today.  If this is not possible, she will give me information for breast center near her home in [place] and I will try to place orders there  Other we will certainly miss her as a patient, I suggested that Ashley Wood might be better served by a primary care doctor near her home who is more familiar with her local resources and referral base This visit occurred during the SARS-CoV-2 public health emergency.  Safety protocols were in place, including screening questions prior to the visit, additional usage of staff PPE, and extensive cleaning of exam room while observing appropriate contact time as indicated for disinfecting solutions.    Signed Lamar Blinks, MD  Received her labs as  below, 11/11-message to patient  Results for orders placed or performed in visit on 10/29/20  CBC  Result Value Ref Range   WBC 5.8 4.0 - 10.5 K/uL   RBC 4.64 3.87 - 5.11 Mil/uL   Platelets 195.0 150 - 400 K/uL   Hemoglobin 13.5 12.0 - 15.0 g/dL   HCT 39.4 36 - 46 %   MCV 84.8 78.0 - 100.0 fl   MCHC 34.2 30.0 - 36.0 g/dL   RDW 14.1 11.5 - 15.5 %  Comprehensive metabolic panel  Result Value Ref Range   Sodium 140 135 - 145 mEq/L   Potassium 4.5 3.5 - 5.1 mEq/L   Chloride 103 96 - 112 mEq/L   CO2 29 19 - 32 mEq/L   Glucose, Bld 111 (H) 70 - 99 mg/dL   BUN 19 6 - 23 mg/dL   Creatinine, Ser 0.83 0.40 - 1.20 mg/dL   Total Bilirubin 0.6 0.2 - 1.2 mg/dL   Alkaline Phosphatase 98 39 - 117 U/L   AST 16 0 - 37 U/L   ALT 31 0 - 35 U/L   Total Protein 7.0 6.0 - 8.3 g/dL   Albumin 4.8 3.5 - 5.2 g/dL   GFR 76.01 >60.00 mL/min   Calcium 10.2 8.4 - 10.5 mg/dL  Hemoglobin A1c  Result Value Ref Range   Hgb A1c MFr Bld 5.6 4.6 - 6.5 %  Lipid panel  Result Value Ref Range   Cholesterol 190 0 - 200 mg/dL   Triglycerides 199.0 (H) 0 - 149 mg/dL   HDL 45.60 >39.00 mg/dL   VLDL 39.8 0.0 - 40.0 mg/dL   LDL Cholesterol 104 (H) 0 - 99 mg/dL   Total CHOL/HDL Ratio 4    NonHDL 144.23   TSH  Result Value Ref Range   TSH 2.66 0.35 - 4.50 uIU/mL  VITAMIN D 25 Hydroxy (Vit-D Deficiency, Fractures)  Result Value Ref Range   VITD 28.45 (L) 30.00 - 100.00 ng/mL

## 2020-10-30 ENCOUNTER — Encounter: Payer: Self-pay | Admitting: Family Medicine

## 2020-11-02 NOTE — Telephone Encounter (Signed)
Called this facility  [facility] [address] [place], [place] [address] Phone: [phone]

## 2020-11-08 ENCOUNTER — Other Ambulatory Visit: Payer: Self-pay | Admitting: Family Medicine

## 2020-11-08 DIAGNOSIS — E785 Hyperlipidemia, unspecified: Secondary | ICD-10-CM

## 2020-11-11 ENCOUNTER — Other Ambulatory Visit: Payer: Self-pay | Admitting: Family Medicine

## 2020-11-11 DIAGNOSIS — N6452 Nipple discharge: Secondary | ICD-10-CM

## 2020-11-11 DIAGNOSIS — N644 Mastodynia: Secondary | ICD-10-CM

## 2020-11-19 ENCOUNTER — Encounter: Payer: Self-pay | Admitting: Family Medicine

## 2020-11-19 DIAGNOSIS — N6325 Unspecified lump in the left breast, overlapping quadrants: Secondary | ICD-10-CM | POA: Diagnosis not present

## 2020-11-19 DIAGNOSIS — R921 Mammographic calcification found on diagnostic imaging of breast: Secondary | ICD-10-CM | POA: Diagnosis not present

## 2020-11-19 DIAGNOSIS — N6012 Diffuse cystic mastopathy of left breast: Secondary | ICD-10-CM | POA: Diagnosis not present

## 2020-12-17 ENCOUNTER — Telehealth: Payer: Self-pay | Admitting: Family Medicine

## 2020-12-17 DIAGNOSIS — N6320 Unspecified lump in the left breast, unspecified quadrant: Secondary | ICD-10-CM

## 2020-12-17 NOTE — Telephone Encounter (Signed)
Order has been emailed as requested.

## 2020-12-17 NOTE — Telephone Encounter (Signed)
Ashley Wood states she has not received e-mail. Can you please re-send.

## 2020-12-17 NOTE — Telephone Encounter (Signed)
Resent again

## 2020-12-17 NOTE — Telephone Encounter (Signed)
Caller: Adrian [facility]) Call back # [phone] Fax is currently down   Patient is scheduled for a breast biopsy they received a referral for an ultrasound. Please e-mail correct referral to [email or web address].

## 2020-12-17 NOTE — Telephone Encounter (Signed)
Please fax referral to [phone] for stereo left breast biopsy. Appt is monday

## 2020-12-21 ENCOUNTER — Other Ambulatory Visit (HOSPITAL_BASED_OUTPATIENT_CLINIC_OR_DEPARTMENT_OTHER): Payer: Self-pay | Admitting: Family Medicine

## 2020-12-21 DIAGNOSIS — R920 Mammographic microcalcification found on diagnostic imaging of breast: Secondary | ICD-10-CM | POA: Diagnosis not present

## 2020-12-21 DIAGNOSIS — R921 Mammographic calcification found on diagnostic imaging of breast: Secondary | ICD-10-CM | POA: Diagnosis not present

## 2020-12-21 NOTE — Telephone Encounter (Signed)
STAT REFERRAL!   Festus Barren called again for order to be fixed  Send fax too : [phone]

## 2020-12-21 NOTE — Telephone Encounter (Signed)
Resent order to email and fax.

## 2020-12-21 NOTE — Addendum Note (Signed)
Addended by: Steve Rattler A on: 12/21/2020 12:15 PM   Modules accepted: Orders

## 2020-12-24 ENCOUNTER — Encounter: Payer: Self-pay | Admitting: Family Medicine

## 2020-12-24 ENCOUNTER — Telehealth: Payer: Self-pay | Admitting: Family Medicine

## 2020-12-24 NOTE — Telephone Encounter (Signed)
Please advise 

## 2020-12-24 NOTE — Telephone Encounter (Signed)
Caller : [facility]  Call Back @ [phone]   Lanora Manis, is requesting recommendations from Dr. Patsy Lager about a breast surgeon for patient.  Patient referred to [facility] for bloody nipples.   Please Advise

## 2020-12-24 NOTE — Telephone Encounter (Signed)
Called Ashley Wood back - had to leave message on machine.  I will contact patient about need to see a breast surgeon

## 2021-01-16 ENCOUNTER — Other Ambulatory Visit: Payer: Self-pay | Admitting: Family Medicine

## 2021-01-16 DIAGNOSIS — T7840XD Allergy, unspecified, subsequent encounter: Secondary | ICD-10-CM

## 2021-05-19 ENCOUNTER — Other Ambulatory Visit: Payer: Self-pay | Admitting: Family Medicine

## 2021-05-19 DIAGNOSIS — E785 Hyperlipidemia, unspecified: Secondary | ICD-10-CM

## 2021-08-14 ENCOUNTER — Other Ambulatory Visit: Payer: Self-pay | Admitting: Family Medicine

## 2021-08-14 DIAGNOSIS — E785 Hyperlipidemia, unspecified: Secondary | ICD-10-CM

## 2021-09-23 DIAGNOSIS — D171 Benign lipomatous neoplasm of skin and subcutaneous tissue of trunk: Secondary | ICD-10-CM | POA: Diagnosis not present

## 2021-09-23 DIAGNOSIS — Z0000 Encounter for general adult medical examination without abnormal findings: Secondary | ICD-10-CM | POA: Diagnosis not present

## 2021-09-23 DIAGNOSIS — Z23 Encounter for immunization: Secondary | ICD-10-CM | POA: Diagnosis not present

## 2021-09-23 DIAGNOSIS — N6452 Nipple discharge: Secondary | ICD-10-CM | POA: Diagnosis not present

## 2021-10-21 DIAGNOSIS — D171 Benign lipomatous neoplasm of skin and subcutaneous tissue of trunk: Secondary | ICD-10-CM | POA: Diagnosis not present

## 2021-12-06 ENCOUNTER — Other Ambulatory Visit: Payer: Self-pay | Admitting: Family Medicine

## 2021-12-06 DIAGNOSIS — E785 Hyperlipidemia, unspecified: Secondary | ICD-10-CM

## 2021-12-27 ENCOUNTER — Other Ambulatory Visit: Payer: Self-pay | Admitting: Family Medicine

## 2021-12-27 DIAGNOSIS — E785 Hyperlipidemia, unspecified: Secondary | ICD-10-CM

## 2021-12-29 ENCOUNTER — Other Ambulatory Visit: Payer: Self-pay | Admitting: Family Medicine

## 2021-12-29 DIAGNOSIS — E785 Hyperlipidemia, unspecified: Secondary | ICD-10-CM
